# Patient Record
Sex: Female | Born: 1935 | Smoking: Never smoker
Health system: Southern US, Community
[De-identification: ages and names within clinical notes are randomized; demographics above are authoritative.]

## PROBLEM LIST (undated history)

## (undated) DIAGNOSIS — I639 Cerebral infarction, unspecified: Secondary | ICD-10-CM

## (undated) DIAGNOSIS — I10 Essential (primary) hypertension: Secondary | ICD-10-CM

## (undated) HISTORY — PX: BLADDER SURGERY: SHX569

---

## 2010-12-14 ENCOUNTER — Encounter (INDEPENDENT_AMBULATORY_CARE_PROVIDER_SITE_OTHER): Payer: Medicare Other | Admitting: Ophthalmology

## 2010-12-14 DIAGNOSIS — H2510 Age-related nuclear cataract, unspecified eye: Secondary | ICD-10-CM

## 2010-12-14 DIAGNOSIS — H43819 Vitreous degeneration, unspecified eye: Secondary | ICD-10-CM

## 2010-12-14 DIAGNOSIS — H3530 Unspecified macular degeneration: Secondary | ICD-10-CM

## 2010-12-14 DIAGNOSIS — H4420 Degenerative myopia, unspecified eye: Secondary | ICD-10-CM

## 2011-12-16 ENCOUNTER — Encounter (INDEPENDENT_AMBULATORY_CARE_PROVIDER_SITE_OTHER): Payer: Medicare Other | Admitting: Ophthalmology

## 2018-02-19 ENCOUNTER — Other Ambulatory Visit: Payer: Self-pay | Admitting: Family Medicine

## 2018-02-19 DIAGNOSIS — R921 Mammographic calcification found on diagnostic imaging of breast: Secondary | ICD-10-CM

## 2018-03-09 ENCOUNTER — Ambulatory Visit
Admission: RE | Admit: 2018-03-09 | Discharge: 2018-03-09 | Disposition: A | Discharge status: Home / Self Care | Payer: Medicare Other | Source: Ambulatory Visit | Attending: Family Medicine | Admitting: Family Medicine

## 2018-03-09 DIAGNOSIS — R921 Mammographic calcification found on diagnostic imaging of breast: Secondary | ICD-10-CM

## 2019-09-02 ENCOUNTER — Emergency Department (HOSPITAL_BASED_OUTPATIENT_CLINIC_OR_DEPARTMENT_OTHER): Payer: Medicare Other

## 2019-09-02 ENCOUNTER — Other Ambulatory Visit: Payer: Self-pay

## 2019-09-02 ENCOUNTER — Inpatient Hospital Stay (HOSPITAL_BASED_OUTPATIENT_CLINIC_OR_DEPARTMENT_OTHER)
Admission: EM | Admit: 2019-09-02 | Discharge: 2019-09-05 | DRG: 690 | Disposition: A | Discharge status: Skilled Nursing Facility | Payer: Medicare Other | Attending: Internal Medicine | Admitting: Internal Medicine

## 2019-09-02 ENCOUNTER — Encounter (HOSPITAL_BASED_OUTPATIENT_CLINIC_OR_DEPARTMENT_OTHER): Payer: Self-pay | Admitting: Emergency Medicine

## 2019-09-02 DIAGNOSIS — Z20822 Contact with and (suspected) exposure to covid-19: Secondary | ICD-10-CM | POA: Diagnosis present

## 2019-09-02 DIAGNOSIS — E785 Hyperlipidemia, unspecified: Secondary | ICD-10-CM | POA: Diagnosis present

## 2019-09-02 DIAGNOSIS — Z7982 Long term (current) use of aspirin: Secondary | ICD-10-CM

## 2019-09-02 DIAGNOSIS — N10 Acute tubulo-interstitial nephritis: Secondary | ICD-10-CM | POA: Diagnosis not present

## 2019-09-02 DIAGNOSIS — W19XXXA Unspecified fall, initial encounter: Secondary | ICD-10-CM | POA: Diagnosis present

## 2019-09-02 DIAGNOSIS — N12 Tubulo-interstitial nephritis, not specified as acute or chronic: Secondary | ICD-10-CM

## 2019-09-02 DIAGNOSIS — Z880 Allergy status to penicillin: Secondary | ICD-10-CM

## 2019-09-02 DIAGNOSIS — M25551 Pain in right hip: Secondary | ICD-10-CM

## 2019-09-02 DIAGNOSIS — M160 Bilateral primary osteoarthritis of hip: Secondary | ICD-10-CM | POA: Diagnosis present

## 2019-09-02 DIAGNOSIS — Z79899 Other long term (current) drug therapy: Secondary | ICD-10-CM

## 2019-09-02 DIAGNOSIS — R4189 Other symptoms and signs involving cognitive functions and awareness: Secondary | ICD-10-CM | POA: Diagnosis present

## 2019-09-02 DIAGNOSIS — E7800 Pure hypercholesterolemia, unspecified: Secondary | ICD-10-CM

## 2019-09-02 DIAGNOSIS — R739 Hyperglycemia, unspecified: Secondary | ICD-10-CM | POA: Diagnosis present

## 2019-09-02 DIAGNOSIS — E039 Hypothyroidism, unspecified: Secondary | ICD-10-CM | POA: Diagnosis present

## 2019-09-02 DIAGNOSIS — Z882 Allergy status to sulfonamides status: Secondary | ICD-10-CM

## 2019-09-02 DIAGNOSIS — K5900 Constipation, unspecified: Secondary | ICD-10-CM | POA: Diagnosis not present

## 2019-09-02 DIAGNOSIS — D649 Anemia, unspecified: Secondary | ICD-10-CM | POA: Diagnosis present

## 2019-09-02 DIAGNOSIS — K219 Gastro-esophageal reflux disease without esophagitis: Secondary | ICD-10-CM | POA: Diagnosis present

## 2019-09-02 DIAGNOSIS — I10 Essential (primary) hypertension: Secondary | ICD-10-CM | POA: Diagnosis present

## 2019-09-02 DIAGNOSIS — Z79890 Hormone replacement therapy (postmenopausal): Secondary | ICD-10-CM

## 2019-09-02 HISTORY — DX: Essential (primary) hypertension: I10

## 2019-09-02 LAB — CBC WITH DIFFERENTIAL/PLATELET
Abs Immature Granulocytes: 0.05 10*3/uL (ref 0.00–0.07)
Basophils Absolute: 0 10*3/uL (ref 0.0–0.1)
Basophils Relative: 0 %
Eosinophils Absolute: 0.1 10*3/uL (ref 0.0–0.5)
Eosinophils Relative: 1 %
HCT: 32.5 % — ABNORMAL LOW (ref 36.0–46.0)
Hemoglobin: 10.4 g/dL — ABNORMAL LOW (ref 12.0–15.0)
Immature Granulocytes: 1 %
Lymphocytes Relative: 1 %
Lymphs Abs: 0.1 10*3/uL — ABNORMAL LOW (ref 0.7–4.0)
MCH: 29.2 pg (ref 26.0–34.0)
MCHC: 32 g/dL (ref 30.0–36.0)
MCV: 91.3 fL (ref 80.0–100.0)
Monocytes Absolute: 0.4 10*3/uL (ref 0.1–1.0)
Monocytes Relative: 4 %
Neutro Abs: 8.2 10*3/uL — ABNORMAL HIGH (ref 1.7–7.7)
Neutrophils Relative %: 93 %
Platelets: 253 10*3/uL (ref 150–400)
RBC: 3.56 MIL/uL — ABNORMAL LOW (ref 3.87–5.11)
RDW: 14.5 % (ref 11.5–15.5)
WBC: 8.8 10*3/uL (ref 4.0–10.5)
nRBC: 0 % (ref 0.0–0.2)

## 2019-09-02 LAB — COMPREHENSIVE METABOLIC PANEL
ALT: 21 U/L (ref 0–44)
AST: 25 U/L (ref 15–41)
Albumin: 4.1 g/dL (ref 3.5–5.0)
Alkaline Phosphatase: 46 U/L (ref 38–126)
Anion gap: 9 (ref 5–15)
BUN: 23 mg/dL (ref 8–23)
CO2: 25 mmol/L (ref 22–32)
Calcium: 8.7 mg/dL — ABNORMAL LOW (ref 8.9–10.3)
Chloride: 103 mmol/L (ref 98–111)
Creatinine, Ser: 0.64 mg/dL (ref 0.44–1.00)
GFR calc Af Amer: >60 mL/min (ref >60)
GFR calc non Af Amer: >60 mL/min (ref >60)
Glucose, Bld: 129 mg/dL — ABNORMAL HIGH (ref 70–99)
Potassium: 3.7 mmol/L (ref 3.5–5.1)
Sodium: 137 mmol/L (ref 135–145)
Total Bilirubin: 0.9 mg/dL (ref 0.3–1.2)
Total Protein: 6.7 g/dL (ref 6.5–8.1)

## 2019-09-02 LAB — URINALYSIS, ROUTINE W REFLEX MICROSCOPIC
Bilirubin Urine: NEGATIVE
Glucose, UA: NEGATIVE mg/dL
Hgb urine dipstick: NEGATIVE
Ketones, ur: NEGATIVE mg/dL
Nitrite: NEGATIVE
Protein, ur: NEGATIVE mg/dL
Specific Gravity, Urine: 1.01 (ref 1.005–1.030)
pH: 5.5 (ref 5.0–8.0)

## 2019-09-02 LAB — PROTIME-INR
INR: 1.1 (ref 0.8–1.2)
Prothrombin Time: 13.7 seconds (ref 11.4–15.2)

## 2019-09-02 LAB — URINALYSIS, MICROSCOPIC (REFLEX): RBC / HPF: NONE SEEN RBC/hpf (ref 0–5)

## 2019-09-02 LAB — LACTIC ACID, PLASMA: Lactic Acid, Venous: 1 mmol/L (ref 0.5–1.9)

## 2019-09-02 LAB — SARS CORONAVIRUS 2 BY RT PCR (HOSPITAL ORDER, PERFORMED IN ~~LOC~~ HOSPITAL LAB): SARS Coronavirus 2: NEGATIVE

## 2019-09-02 MED ORDER — ATORVASTATIN CALCIUM 80 MG PO TABS
80.0000 mg | ORAL_TABLET | Freq: Every day | ORAL | Status: DC
  Administered 2019-09-03 – 2019-09-05 (×3): 80 mg via ORAL
  Filled 2019-09-02 (×3): qty 1

## 2019-09-02 MED ORDER — LACTATED RINGERS IV BOLUS (SEPSIS)
1000.0000 mL | Freq: Once | INTRAVENOUS | Status: AC
  Administered 2019-09-02: 1000 mL via INTRAVENOUS

## 2019-09-02 MED ORDER — ACETAMINOPHEN 500 MG PO TABS
1000.0000 mg | ORAL_TABLET | Freq: Once | ORAL | Status: AC
  Administered 2019-09-02: 1000 mg via ORAL
  Filled 2019-09-02: qty 2

## 2019-09-02 MED ORDER — ASPIRIN EC 81 MG PO TBEC
81.0000 mg | DELAYED_RELEASE_TABLET | Freq: Every day | ORAL | Status: DC
  Administered 2019-09-03 – 2019-09-05 (×3): 81 mg via ORAL
  Filled 2019-09-02 (×3): qty 1

## 2019-09-02 MED ORDER — FENTANYL CITRATE (PF) 100 MCG/2ML IJ SOLN
100.0000 ug | Freq: Once | INTRAMUSCULAR | Status: AC
  Administered 2019-09-02: 100 ug via INTRAVENOUS
  Filled 2019-09-02: qty 2

## 2019-09-02 MED ORDER — LISINOPRIL 20 MG PO TABS
20.0000 mg | ORAL_TABLET | Freq: Every day | ORAL | Status: DC
  Administered 2019-09-03 – 2019-09-05 (×3): 20 mg via ORAL
  Filled 2019-09-02 (×3): qty 1

## 2019-09-02 MED ORDER — LEVOTHYROXINE SODIUM 88 MCG PO TABS
88.0000 ug | ORAL_TABLET | Freq: Every day | ORAL | Status: DC
  Administered 2019-09-03 – 2019-09-05 (×3): 88 ug via ORAL
  Filled 2019-09-02 (×4): qty 1

## 2019-09-02 MED ORDER — SODIUM CHLORIDE 0.9 % IV SOLN
1.0000 g | INTRAVENOUS | Status: DC
  Administered 2019-09-02: 1 g via INTRAVENOUS
  Filled 2019-09-02 (×2): qty 10

## 2019-09-02 MED ORDER — PANTOPRAZOLE SODIUM 40 MG PO TBEC
40.0000 mg | DELAYED_RELEASE_TABLET | Freq: Every day | ORAL | Status: DC
  Administered 2019-09-03 – 2019-09-05 (×3): 40 mg via ORAL
  Filled 2019-09-02 (×3): qty 1

## 2019-09-02 MED ORDER — ONDANSETRON HCL 4 MG/2ML IJ SOLN
4.0000 mg | Freq: Once | INTRAMUSCULAR | Status: AC
  Administered 2019-09-02: 4 mg via INTRAVENOUS
  Filled 2019-09-02: qty 2

## 2019-09-02 MED ORDER — ESCITALOPRAM OXALATE 10 MG PO TABS
10.0000 mg | ORAL_TABLET | Freq: Every morning | ORAL | Status: DC
  Administered 2019-09-03 – 2019-09-05 (×3): 10 mg via ORAL
  Filled 2019-09-02 (×4): qty 1

## 2019-09-02 MED ORDER — FENOFIBRATE 160 MG PO TABS
160.0000 mg | ORAL_TABLET | Freq: Every day | ORAL | Status: DC
  Administered 2019-09-03 – 2019-09-05 (×3): 160 mg via ORAL
  Filled 2019-09-02 (×4): qty 1

## 2019-09-02 NOTE — ED Triage Notes (Signed)
Pt c/o right hip pain after falling sliding off bed. Pt also has known kidney stone causing flank pain and nausea. Pt with fever on arrival.

## 2019-09-02 NOTE — ED Provider Notes (Addendum)
[facility] EMERGENCY DEPARTMENT Provider Note   CSN: [id number] Arrival date & time: 09/02/19  1903     History Chief Complaint  Patient presents with  . Flank Pain  . Fall    Philipp OvensDeeanne Island is a 84 y.o. female.  84 year old female with past medical history including hypertension who presents with right flank pain, fevers, and right hip pain.  Patient presented to outside hospital ED on 6/28 with right flank pain and dysuria.  She was diagnosed with kidney stone and UTI and started on antibiotics.  She returned to the ED on 6/29 with ongoing symptoms as well as fevers.  She was again eventually discharged home.  She notes that her symptoms have continued and she feels terrible, generally weak and sick.  She continues to have right flank pain and nausea.  This evening, she slid out of bed and landed on her backside.  She has been having right hip pain for months but states the right hip pain has been worse since the fall.  The history is provided by the patient.  Flank Pain  Fall       Past Medical History:  Diagnosis Date  . Hypertension     Patient Active Problem List   Diagnosis Date Noted  . Pyelonephritis 09/02/2019    * The histories are not reviewed yet. Please review them in the "History" navigator section and refresh this SmartLink.   OB History   No obstetric history on file.     No family history on file.  Social History   Tobacco Use  . Smoking status: Not on file  Substance Use Topics  . Alcohol use: Not on file  . Drug use: Not on file    Home Medications Prior to Admission medications   Medication Sig Start Date End Date Taking? Authorizing Provider  anastrozole (ARIMIDEX) 1 MG tablet Take 1 tablet by mouth daily. 08/13/19  Yes [provider]  atorvastatin (LIPITOR) 80 MG tablet Take 1 tablet by mouth daily. 06/21/19  Yes [provider]  escitalopram (LEXAPRO) 10 MG tablet Take 1 tablet by mouth every morning.  06/07/19  Yes [provider]  fenofibrate 160 MG tablet Take 1 tablet by mouth daily. 04/20/19  Yes [provider]  fluticasone (FLONASE) 50 MCG/ACT nasal spray TWO SPRAYS EACH NOSTRIL DAILY 04/28/19  Yes [provider]  HYDROcodone-acetaminophen (NORCO/VICODIN) 5-325 MG tablet Take by mouth. 08/30/19 09/04/19 Yes [provider]  levothyroxine (SYNTHROID) 88 MCG tablet Take 1 tablet by mouth daily. 08/06/19  Yes [provider]  lisinopril (ZESTRIL) 20 MG tablet Take 1 tablet by mouth daily. 03/29/19  Yes [provider]  aspirin 81 MG EC tablet Take by mouth.    [provider]  meloxicam (MOBIC) 7.5 MG tablet Take 7.5 mg by mouth daily as needed. 08/17/19   [provider]  omeprazole (PRILOSEC) 20 MG capsule Take 20 mg by mouth daily. 08/25/19   [provider]  rOPINIRole (REQUIP) 0.5 MG tablet Take 0.5 mg by mouth at bedtime. 09/02/19   [provider]  tiZANidine (ZANAFLEX) 4 MG tablet Take 4 mg by mouth 3 (three) times daily. 08/25/19   [provider]    Allergies    Penicillins and Sulfa antibiotics  Review of Systems   Review of Systems  Genitourinary: Positive for flank pain.   All other systems reviewed and are negative except that which was mentioned in HPI  Physical Exam Updated Vital Signs BP Marland Kitchen(!)  142/69   Pulse 68   Temp 99.7 F (37.6 C) (Oral)   Resp 18   Ht 5\' 3"  (1.6 m)   Wt 73.5 kg   SpO2 100%   BMI 28.70 kg/m   Physical Exam Vitals and nursing note reviewed.  Constitutional:      General: She is not in acute distress.    Appearance: She is well-developed. She is ill-appearing.  HENT:     Head: Normocephalic and atraumatic.  Eyes:     Conjunctiva/sclera: Conjunctivae normal.  Cardiovascular:     Rate and Rhythm: Normal rate and regular rhythm.     Pulses: Normal pulses.     Heart sounds: Normal heart sounds. No murmur heard.   Pulmonary:     Effort: Pulmonary  effort is normal.     Breath sounds: Normal breath sounds.  Abdominal:     General: Bowel sounds are normal. There is no distension.     Palpations: Abdomen is soft.     Tenderness: There is no abdominal tenderness.  Musculoskeletal:        General: No deformity.     Cervical back: Neck supple.     Comments: R hip with normal ROM, no obvious deformity Trace BLE edema  Skin:    General: Skin is warm and dry.  Neurological:     Mental Status: She is alert and oriented to person, place, and time.     Comments: Fluent speech  Psychiatric:        Judgment: Judgment normal.     ED Results / Procedures / Treatments   Labs (all labs ordered are listed, but only abnormal results are displayed) Labs Reviewed  COMPREHENSIVE METABOLIC PANEL - Abnormal; Notable for the following components:      Result Value   Glucose, Bld 129 (*)    Calcium 8.7 (*)    All other components within normal limits  CBC WITH DIFFERENTIAL/PLATELET - Abnormal; Notable for the following components:   RBC 3.56 (*)    Hemoglobin 10.4 (*)    HCT 32.5 (*)    Neutro Abs 8.2 (*)    Lymphs Abs 0.1 (*)    All other components within normal limits  URINALYSIS, ROUTINE W REFLEX MICROSCOPIC - Abnormal; Notable for the following components:   Leukocytes,Ua TRACE (*)    All other components within normal limits  URINALYSIS, MICROSCOPIC (REFLEX) - Abnormal; Notable for the following components:   Bacteria, UA RARE (*)    All other components within normal limits  SARS CORONAVIRUS 2 BY RT PCR (HOSPITAL ORDER, PERFORMED IN [facility])  CULTURE, BLOOD (ROUTINE X 2)  CULTURE, BLOOD (ROUTINE X 2)  URINE CULTURE  LACTIC ACID, PLASMA  PROTIME-INR    EKG EKG Interpretation  Date/Time:  Thursday September 02 2019 20:00:33 EDT Ventricular Rate:  80 PR Interval:    QRS Duration: 86 QT Interval:  374 QTC Calculation: 432 R Axis:   -9 Text Interpretation: Atrial flutter with predominant 4:1 AV block Consider  anterior infarct No previous ECGs available Confirmed by 07-09-2001 [phone]) on 09/02/2019 8:30:27 PM   Radiology DG Chest Portable 1 View  Result Date: 09/02/2019 CLINICAL DATA:  Fever, right hip pain, flank pain EXAM: PORTABLE CHEST 1 VIEW COMPARISON:  None. FINDINGS: Single frontal view of the chest demonstrates mild enlargement the cardiac silhouette. Mild diffuse interstitial prominence likely reflects scarring. No airspace disease, effusion, or pneumothorax. Prominent vascular shadow right paratracheal region. No acute bony abnormalities. IMPRESSION: 1. Interstitial  scarring without superimposed airspace disease. Electronically Signed   By: Sharlet Salina M.D.   On: 09/02/2019 22:00   CT Renal Stone Study  Result Date: 09/02/2019 CLINICAL DATA:  Right flank pain EXAM: CT ABDOMEN AND PELVIS WITHOUT CONTRAST TECHNIQUE: Multidetector CT imaging of the abdomen and pelvis was performed following the standard protocol without IV contrast. COMPARISON:  None. FINDINGS: Lower chest: Bibasilar atelectasis. Cardiomegaly, coronary artery disease. No effusions. Hepatobiliary: Small layering stones within the gallbladder. No focal hepatic abnormality. Pancreas: No focal abnormality or ductal dilatation. Spleen: No focal abnormality.  Normal size. Adrenals/Urinary Tract: Calcification in the left renal hilum felt reflect calcified renal artery aneurysm, 9 mm. Calcification in the right renal hilum felt to be renovascular. No urinary tract stones or hydronephrosis. No renal or adrenal mass. Urinary bladder unremarkable. Stomach/Bowel: Appendix not definitively seen. Moderate stool burden in the colon. No evidence of bowel obstruction. Vascular/Lymphatic: Aortic atherosclerosis. No enlarged abdominal or pelvic lymph nodes. Reproductive: Prior hysterectomy.  No adnexal masses. Other: No free fluid or free air. Musculoskeletal: No acute bony abnormality. Degenerative changes in the lumbar spine. IMPRESSION: No renal or  ureteral stones.  No hydronephrosis. Aortic atherosclerosis. 9 mm left renal artery aneurysm. Moderate stool burden in the colon. No acute findings. Electronically Signed   By: Charlett Nose M.D.   On: 09/02/2019 21:02   DG Hip Unilat With Pelvis 2-3 Views Right  Result Date: 09/02/2019 CLINICAL DATA:  Chronic right hip pain EXAM: DG HIP (WITH OR WITHOUT PELVIS) 2-3V RIGHT COMPARISON:  None. FINDINGS: Degenerative changes in the hips bilaterally, right greater than left with joint space narrowing and spurring. No acute bony abnormality. Specifically, no fracture, subluxation, or dislocation. IMPRESSION: Degenerative changes in the hips bilaterally, right greater than left. No acute bony abnormality. Electronically Signed   By: Charlett Nose M.D.   On: 09/02/2019 20:59    Procedures Procedures (including critical care time) CRITICAL CARE Performed by: Ambrose Finland Sherryn Pollino   Total critical care time: 30 minutes  Critical care time was exclusive of separately billable procedures and treating other patients.  Critical care was necessary to treat or prevent imminent or life-threatening deterioration.  Critical care was time spent personally by me on the following activities: development of treatment plan with patient and/or surrogate as well as nursing, discussions with consultants, evaluation of patient's response to treatment, examination of patient, obtaining history from patient or surrogate, ordering and performing treatments and interventions, ordering and review of laboratory studies, ordering and review of radiographic studies, pulse oximetry and re-evaluation of patient's condition.  Medications Ordered in ED Medications  cefTRIAXone (ROCEPHIN) 1 g in sodium chloride 0.9 % 100 mL IVPB ( Intravenous Stopped 09/02/19 2031)  acetaminophen (TYLENOL) tablet 1,000 mg (1,000 mg Oral Given 09/02/19 1922)  ondansetron (ZOFRAN) injection 4 mg (4 mg Intravenous Given 09/02/19 1923)  lactated ringers bolus  1,000 mL (0 mLs Intravenous Stopped 09/02/19 2105)  fentaNYL (SUBLIMAZE) injection 100 mcg (100 mcg Intravenous Given 09/02/19 2114)    ED Course  I have reviewed the triage vital signs and the nursing notes.  Pertinent labs & imaging results that were available during my care of the patient were reviewed by me and considered in my medical decision making (see chart for details).    MDM Rules/Calculators/A&P                          Pt ill appearing on exam, alert and oriented, T103, remainder of  VSS. Hip without obvious injury, normal ROM.  Because of the fever and known stone with UTI, initiated a code sepsis with blood and urine cultures.  Gave ceftriaxone for suspected urinary source.  Lab work shows reassuring CMP, normal WBC count, normal lactate, negative COVID-19, UA with trace leukocytes, a few WBCs and rare bacteria.  I did review urinalysis results from outside hospitals on 6/29 and 6/28 which were nitrite positive and barely clearly suggested infection.  Obtained CT renal study which shows that previous renal stone has passed, no evidence of current ureteral stone.  Chest x-ray negative acute.  Hip x-ray with degenerative changes.  Patient's fever has improved and her vital signs have remained stable.  She was placed on some supplemental oxygen after receiving fentanyl for pain.  Because of her persistent symptoms despite antibiotics at home, recommended admission.  Discussed with [facility] hospitalist, Dr. Loney Loh, pt will be transferred for further care. Final Clinical Impression(s) / ED Diagnoses Final diagnoses:  Pyelonephritis  Right hip pain    Rx / DC Orders ED Discharge Orders    None       Juliza Machnik, Ambrose Finland, MD 09/02/19 2250    Reather Steller, Ambrose Finland, MD 09/02/19 2251

## 2019-09-02 NOTE — ED Notes (Signed)
ED Provider at bedside. 

## 2019-09-02 NOTE — ED Triage Notes (Signed)
Pt arrives via EMS from [facility] c/o recurrent R hip and flank pain which causes difficulty walking. Pt slid to the floor due to the pain. Endorses nausea.

## 2019-09-03 DIAGNOSIS — Z79890 Hormone replacement therapy (postmenopausal): Secondary | ICD-10-CM | POA: Diagnosis not present

## 2019-09-03 DIAGNOSIS — K219 Gastro-esophageal reflux disease without esophagitis: Secondary | ICD-10-CM | POA: Diagnosis present

## 2019-09-03 DIAGNOSIS — N12 Tubulo-interstitial nephritis, not specified as acute or chronic: Secondary | ICD-10-CM

## 2019-09-03 DIAGNOSIS — Z7982 Long term (current) use of aspirin: Secondary | ICD-10-CM | POA: Diagnosis not present

## 2019-09-03 DIAGNOSIS — M160 Bilateral primary osteoarthritis of hip: Secondary | ICD-10-CM | POA: Diagnosis present

## 2019-09-03 DIAGNOSIS — E7800 Pure hypercholesterolemia, unspecified: Secondary | ICD-10-CM

## 2019-09-03 DIAGNOSIS — I10 Essential (primary) hypertension: Secondary | ICD-10-CM

## 2019-09-03 DIAGNOSIS — Z880 Allergy status to penicillin: Secondary | ICD-10-CM | POA: Diagnosis not present

## 2019-09-03 DIAGNOSIS — M25551 Pain in right hip: Secondary | ICD-10-CM

## 2019-09-03 DIAGNOSIS — D649 Anemia, unspecified: Secondary | ICD-10-CM | POA: Diagnosis present

## 2019-09-03 DIAGNOSIS — Z882 Allergy status to sulfonamides status: Secondary | ICD-10-CM | POA: Diagnosis not present

## 2019-09-03 DIAGNOSIS — N10 Acute tubulo-interstitial nephritis: Secondary | ICD-10-CM | POA: Diagnosis present

## 2019-09-03 DIAGNOSIS — Z20822 Contact with and (suspected) exposure to covid-19: Secondary | ICD-10-CM | POA: Diagnosis present

## 2019-09-03 DIAGNOSIS — E039 Hypothyroidism, unspecified: Secondary | ICD-10-CM | POA: Diagnosis present

## 2019-09-03 DIAGNOSIS — K5900 Constipation, unspecified: Secondary | ICD-10-CM | POA: Diagnosis not present

## 2019-09-03 DIAGNOSIS — W19XXXA Unspecified fall, initial encounter: Secondary | ICD-10-CM | POA: Diagnosis present

## 2019-09-03 DIAGNOSIS — R739 Hyperglycemia, unspecified: Secondary | ICD-10-CM | POA: Diagnosis present

## 2019-09-03 DIAGNOSIS — R4189 Other symptoms and signs involving cognitive functions and awareness: Secondary | ICD-10-CM | POA: Diagnosis present

## 2019-09-03 DIAGNOSIS — E785 Hyperlipidemia, unspecified: Secondary | ICD-10-CM | POA: Diagnosis present

## 2019-09-03 DIAGNOSIS — Z79899 Other long term (current) drug therapy: Secondary | ICD-10-CM | POA: Diagnosis not present

## 2019-09-03 LAB — COMPREHENSIVE METABOLIC PANEL
ALT: 21 U/L (ref 0–44)
AST: 27 U/L (ref 15–41)
Albumin: 3 g/dL — ABNORMAL LOW (ref 3.5–5.0)
Alkaline Phosphatase: 39 U/L (ref 38–126)
Anion gap: 8 (ref 5–15)
BUN: 11 mg/dL (ref 8–23)
CO2: 25 mmol/L (ref 22–32)
Calcium: 8.2 mg/dL — ABNORMAL LOW (ref 8.9–10.3)
Chloride: 105 mmol/L (ref 98–111)
Creatinine, Ser: 0.58 mg/dL (ref 0.44–1.00)
GFR calc Af Amer: >60 mL/min (ref >60)
GFR calc non Af Amer: >60 mL/min (ref >60)
Glucose, Bld: 94 mg/dL (ref 70–99)
Potassium: 3 mmol/L — ABNORMAL LOW (ref 3.5–5.1)
Sodium: 138 mmol/L (ref 135–145)
Total Bilirubin: 0.4 mg/dL (ref 0.3–1.2)
Total Protein: 5.2 g/dL — ABNORMAL LOW (ref 6.5–8.1)

## 2019-09-03 LAB — PHOSPHORUS: Phosphorus: 3.2 mg/dL (ref 2.5–4.6)

## 2019-09-03 LAB — MAGNESIUM: Magnesium: 1.5 mg/dL — ABNORMAL LOW (ref 1.7–2.4)

## 2019-09-03 MED ORDER — ACETAMINOPHEN 650 MG RE SUPP: 650.0000 mg | Freq: Four times a day (QID) | RECTAL | Status: DC | PRN

## 2019-09-03 MED ORDER — MAGNESIUM OXIDE 400 (241.3 MG) MG PO TABS
400.0000 mg | ORAL_TABLET | Freq: Two times a day (BID) | ORAL | Status: AC
  Administered 2019-09-03 (×2): 400 mg via ORAL
  Filled 2019-09-03 (×2): qty 1

## 2019-09-03 MED ORDER — ACETAMINOPHEN 500 MG PO TABS
1000.0000 mg | ORAL_TABLET | Freq: Once | ORAL | Status: AC
  Administered 2019-09-03: 1000 mg via ORAL
  Filled 2019-09-03: qty 2

## 2019-09-03 MED ORDER — FENTANYL CITRATE (PF) 100 MCG/2ML IJ SOLN
100.0000 ug | Freq: Once | INTRAMUSCULAR | Status: AC
  Administered 2019-09-03: 100 ug via INTRAVENOUS
  Filled 2019-09-03: qty 2

## 2019-09-03 MED ORDER — ACETAMINOPHEN 325 MG PO TABS: 650.0000 mg | ORAL_TABLET | Freq: Four times a day (QID) | ORAL | Status: DC | PRN

## 2019-09-03 MED ORDER — CEPHALEXIN 500 MG PO CAPS
500.0000 mg | ORAL_CAPSULE | Freq: Three times a day (TID) | ORAL | Status: DC
  Administered 2019-09-03 – 2019-09-05 (×7): 500 mg via ORAL
  Filled 2019-09-03 (×7): qty 1

## 2019-09-03 MED ORDER — ENOXAPARIN SODIUM 40 MG/0.4ML ~~LOC~~ SOLN: 40.0000 mg | Freq: Every day | SUBCUTANEOUS | Status: DC

## 2019-09-03 MED ORDER — CALCIUM CARBONATE 1250 (500 CA) MG PO TABS
1.0000 | ORAL_TABLET | Freq: Every day | ORAL | Status: DC
  Administered 2019-09-03 – 2019-09-05 (×3): 500 mg via ORAL
  Filled 2019-09-03 (×3): qty 1

## 2019-09-03 MED ORDER — POLYETHYLENE GLYCOL 3350 17 G PO PACK
17.0000 g | PACK | Freq: Three times a day (TID) | ORAL | Status: AC
  Administered 2019-09-03 – 2019-09-04 (×2): 17 g via ORAL
  Filled 2019-09-03 (×3): qty 1

## 2019-09-03 MED ORDER — POTASSIUM CHLORIDE CRYS ER 20 MEQ PO TBCR
40.0000 meq | EXTENDED_RELEASE_TABLET | Freq: Two times a day (BID) | ORAL | Status: AC
  Administered 2019-09-03 (×2): 40 meq via ORAL
  Filled 2019-09-03 (×2): qty 2

## 2019-09-03 NOTE — Progress Notes (Signed)
PT Cancellation Note  Patient Details Name: Djeneba Barsch MRN: [id number] DOB: 1935/12/31   Cancelled Treatment:    Reason Eval/Treat Not Completed: Other (comment).  Eating and will reattempt as pt can allow.   Ivar Drape 09/03/2019, 12:46 PM   Samul Dada, PT MS Acute Rehab Dept. Number: [facility] [id number] and [facility] [phone]

## 2019-09-03 NOTE — Progress Notes (Addendum)
[facility] PROGRESS NOTE    Progress Note  Victoria Morris  WGN:[id number] DOB: [birth year] DOA: 09/02/2019 PCP: Patient, No Pcp Per     Brief Narrative:   Victoria Morris is an 84 y.o. female history significant of essential hypertension, GERD and hypothyroidism who was seen at her PCPs office on 09/01/2027 day prior to admission she was complaining of right side flank pain and difficulty urinating was diagnosed with a kidney stone and UTI was discharged on antibiotics (ciprofloxacin) home came back for ongoing symptoms as well as fever which has been worsening. She was found to be febrile hemodynamically stable UA was unimpressive she has been on antibiotics Assessment/Plan:   Acute pyelonephritis Patient is having severe for about a week and flank pain.  She was started on Cipro by her PCP. In the ED here she was started on IV Rocephin going back through her chart her UA on the 28th of this year was positive for a coli sensitive to Keflex. Urine culture will be inconclusive as she was previously on antibiotics. De-escalate antibiotic treatment to Keflex    Accidental fall leading to right hip pain: Hip x-ray showed degenerative changes but no acute fractures. We have consulted PT OT.  Hyperlipidemia: Continue Lipitor and fenofibrate.  Essential hypertension: Continue lisinopril blood pressure is fairly controlled.  Severe constipation: Start MiraLAX 3 times daily    DVT prophylaxis: lovenox Family Communication:none Status is: Inpatient  Remains inpatient appropriate because:Hemodynamically unstable   Dispo: The patient is from: Home              Anticipated d/c is to: Home              Anticipated d/c date is: 1 day              Patient currently is not medically stable to d/c.  Code Status:     Code Status Orders  (From admission, onward)         Start     Ordered   09/03/19 0437  Full code  Continuous        09/03/19 0442        Code  Status History    This patient has a current code status but no historical code status.   Advance Care Planning Activity        IV Access:    Peripheral IV   Procedures and diagnostic studies:   DG Chest Portable 1 View  Result Date: 09/02/2019 CLINICAL DATA:  Fever, right hip pain, flank pain EXAM: PORTABLE CHEST 1 VIEW COMPARISON:  None. FINDINGS: Single frontal view of the chest demonstrates mild enlargement the cardiac silhouette. Mild diffuse interstitial prominence likely reflects scarring. No airspace disease, effusion, or pneumothorax. Prominent vascular shadow right paratracheal region. No acute bony abnormalities. IMPRESSION: 1. Interstitial scarring without superimposed airspace disease. Electronically Signed   By: Sharlet Salina M.D.   On: 09/02/2019 22:00   CT Renal Stone Study  Result Date: 09/02/2019 CLINICAL DATA:  Right flank pain EXAM: CT ABDOMEN AND PELVIS WITHOUT CONTRAST TECHNIQUE: Multidetector CT imaging of the abdomen and pelvis was performed following the standard protocol without IV contrast. COMPARISON:  None. FINDINGS: Lower chest: Bibasilar atelectasis. Cardiomegaly, coronary artery disease. No effusions. Hepatobiliary: Small layering stones within the gallbladder. No focal hepatic abnormality. Pancreas: No focal abnormality or ductal dilatation. Spleen: No focal abnormality.  Normal size. Adrenals/Urinary Tract: Calcification in the left renal hilum felt reflect calcified renal artery aneurysm, 9 mm. Calcification in the right  renal hilum felt to be renovascular. No urinary tract stones or hydronephrosis. No renal or adrenal mass. Urinary bladder unremarkable. Stomach/Bowel: Appendix not definitively seen. Moderate stool burden in the colon. No evidence of bowel obstruction. Vascular/Lymphatic: Aortic atherosclerosis. No enlarged abdominal or pelvic lymph nodes. Reproductive: Prior hysterectomy.  No adnexal masses. Other: No free fluid or free air. Musculoskeletal:  No acute bony abnormality. Degenerative changes in the lumbar spine. IMPRESSION: No renal or ureteral stones.  No hydronephrosis. Aortic atherosclerosis. 9 mm left renal artery aneurysm. Moderate stool burden in the colon. No acute findings. Electronically Signed   By: Charlett Nose M.D.   On: 09/02/2019 21:02   DG Hip Unilat With Pelvis 2-3 Views Right  Result Date: 09/02/2019 CLINICAL DATA:  Chronic right hip pain EXAM: DG HIP (WITH OR WITHOUT PELVIS) 2-3V RIGHT COMPARISON:  None. FINDINGS: Degenerative changes in the hips bilaterally, right greater than left with joint space narrowing and spurring. No acute bony abnormality. Specifically, no fracture, subluxation, or dislocation. IMPRESSION: Degenerative changes in the hips bilaterally, right greater than left. No acute bony abnormality. Electronically Signed   By: Charlett Nose M.D.   On: 09/02/2019 20:59     Medical Consultants:    None.  Anti-Infectives:   IV Rocephin  Subjective:    Victoria Morris she relates her back pain is better, she continues to have dysuria.  Objective:    Vitals:   09/03/19 0000 09/03/19 0130 09/03/19 0230 09/03/19 0414  BP: (!) 168/63 (!) 165/88 (!) 154/71 (!) 142/54  Pulse: 74 81 75 66  Resp: 15 17 20 16   Temp:  (!) 102 F (38.9 C) 100.1 F (37.8 C) 98 F (36.7 C)  TempSrc:  Oral  Oral  SpO2: 96% 97% 97% 91%  Weight:      Height:       SpO2: 91 % O2 Flow Rate (L/min): 2 L/min   Intake/Output Summary (Last 24 hours) at 09/03/2019 0724 Last data filed at 09/02/2019 2245 Gross per 24 hour  Intake 1096.05 ml  Output 400 ml  Net 696.05 ml   Filed Weights   09/02/19 1913  Weight: 73.5 kg    Exam: General exam: In no acute distress. Respiratory system: Good air movement and clear to auscultation. Cardiovascular system: S1 & S2 heard, RRR. No JVD. Gastrointestinal system: Abdomen is nondistended, soft and nontender.  Extremities: No pain with external rotation. Skin: No rashes,  lesions or ulcers Psychiatry: Judgement and insight appear normal. Mood & affect appropriate.    Data Reviewed:    Labs: Basic Metabolic Panel: Recent Labs  Lab 09/02/19 1919  NA 137  K 3.7  CL 103  CO2 25  GLUCOSE 129*  BUN 23  CREATININE 0.64  CALCIUM 8.7*   GFR Estimated Creatinine Clearance: 52 mL/min (by C-G formula based on SCr of 0.64 mg/dL). Liver Function Tests: Recent Labs  Lab 09/02/19 1919  AST 25  ALT 21  ALKPHOS 46  BILITOT 0.9  PROT 6.7  ALBUMIN 4.1   No results for input(s): LIPASE, AMYLASE in the last 168 hours. No results for input(s): AMMONIA in the last 168 hours. Coagulation profile Recent Labs  Lab 09/02/19 1919  INR 1.1   COVID-19 Labs  No results for input(s): DDIMER, FERRITIN, LDH, CRP in the last 72 hours.  Lab Results  Component Value Date   SARSCOV2NAA NEGATIVE 09/02/2019    CBC: Recent Labs  Lab 09/02/19 1919  WBC 8.8  NEUTROABS 8.2*  HGB 10.4*  HCT  32.5*  MCV 91.3  PLT 253   Cardiac Enzymes: No results for input(s): CKTOTAL, CKMB, CKMBINDEX, TROPONINI in the last 168 hours. BNP (last 3 results) No results for input(s): PROBNP in the last 8760 hours. CBG: No results for input(s): GLUCAP in the last 168 hours. D-Dimer: No results for input(s): DDIMER in the last 72 hours. Hgb A1c: No results for input(s): HGBA1C in the last 72 hours. Lipid Profile: No results for input(s): CHOL, HDL, LDLCALC, TRIG, CHOLHDL, LDLDIRECT in the last 72 hours. Thyroid function studies: No results for input(s): TSH, T4TOTAL, T3FREE, THYROIDAB in the last 72 hours.  Invalid input(s): FREET3 Anemia work up: No results for input(s): VITAMINB12, FOLATE, FERRITIN, TIBC, IRON, RETICCTPCT in the last 72 hours. Sepsis Labs: Recent Labs  Lab 09/02/19 1919  WBC 8.8  LATICACIDVEN 1.0   Microbiology Recent Results (from the past 240 hour(s))  Culture, blood (Routine x 2)     Status: None (Preliminary result)   Collection Time:  09/02/19  7:19 PM   Specimen: BLOOD  Result Value Ref Range Status   Specimen Description   Final    BLOOD BLOOD LEFT ARM Performed at [facility], [address] [place], [place] [address]    Special Requests   Final    BOTTLES DRAWN AEROBIC AND ANAEROBIC Blood Culture adequate volume Performed at [facility] [place] [facility], [address] [place], [place] [address]    Culture   Final    NO GROWTH < 12 HOURS Performed at [facility], [address] [place], [place] [address]    Report Status PENDING  Incomplete  Culture, blood (Routine x 2)     Status: None (Preliminary result)   Collection Time: 09/02/19  7:35 PM   Specimen: BLOOD  Result Value Ref Range Status   Specimen Description   Final    BLOOD BLOOD LEFT HAND Performed at [facility] [place] [facility], [address] [place], [place] [address]    Special Requests   Final    BOTTLES DRAWN AEROBIC AND ANAEROBIC Blood Culture adequate volume Performed at [facility] [place] [facility], [address] [place], [place] [address]    Culture   Final    NO GROWTH < 12 HOURS Performed at [facility], [address] [place], [place] [address]    Report Status PENDING  Incomplete  SARS Coronavirus 2 by RT PCR (hospital order, performed in [facility] hospital lab) Nasopharyngeal Nasopharyngeal Swab     Status: None   Collection Time: 09/02/19  7:55 PM   Specimen: Nasopharyngeal Swab  Result Value Ref Range Status   SARS Coronavirus 2 NEGATIVE NEGATIVE Final    Comment: (NOTE) SARS-CoV-2 target nucleic acids are NOT DETECTED.  The SARS-CoV-2 RNA is generally detectable in upper and lower respiratory specimens during the acute phase of infection. The lowest concentration of SARS-CoV-2 viral copies this assay can detect is 250 copies / mL. A negative result does not preclude SARS-CoV-2 infection and should not be used as the sole basis for treatment or other patient management decisions.  A negative result may  occur with improper specimen collection / handling, submission of specimen other than nasopharyngeal swab, presence of viral mutation(s) within the areas targeted by this assay, and inadequate number of viral copies (<250 copies / mL). A negative result must be combined with clinical observations, patient history, and epidemiological information.  Fact Sheet for Patients:   BoilerBrush.com.cyhttps://www.fda.gov/media/136312/download  Fact Sheet  for Healthcare Providers: https://pope.com/  This test is not yet approved or  cleared by the [place] and has been authorized for detection and/or diagnosis of SARS-CoV-2 by FDA under an Emergency Use Authorization (EUA).  This EUA will remain in effect (meaning this test can be used) for the duration of the COVID-19 declaration under Section 564(b)(1) of the Act, 21 U.S.C. section 360bbb-3(b)(1), unless the authorization is terminated or revoked sooner.  Performed at [facility], [address] [place], [place] [address]      Medications:   . aspirin EC  81 mg Oral Daily  . atorvastatin  80 mg Oral Daily  . calcium carbonate  1 tablet Oral Q breakfast  . enoxaparin (LOVENOX) injection  40 mg Subcutaneous Daily  . escitalopram  10 mg Oral q morning - 10a  . fenofibrate  160 mg Oral Daily  . levothyroxine  88 mcg Oral Daily  . lisinopril  20 mg Oral Daily  . pantoprazole  40 mg Oral Daily   Continuous Infusions: . cefTRIAXone (ROCEPHIN)  IV Stopped (09/02/19 2026)      LOS: 0 days   Marinda Elk  [facility]  09/03/2019, 7:24 AM

## 2019-09-03 NOTE — TOC Initial Note (Signed)
Transition of Care [facility]) - Initial/Assessment Note    Patient Details  Name: Victoria Morris MRN: [id number] Date of Birth: 1935-10-22  Transition of Care [facility]) CM/SW Contact:    Doy Hutching, LCSW Phone Number: 09/03/2019, 10:37 AM  Clinical Narrative:                 CSW spoke with pt at bedside. Introduced self, role, reason for visit. Pt from home, she states she has just moved into a new apartment but has difficulty telling me where it is. Pt able to share that she uses a rollator to get around usually. Confirmed PCP and received permission to speak with her son.   Pt son Maurine Minister contacted at [phone]. CSW introduced self, role, reason for call. Pt son clarifies pt from [facility] which is senior living (similar to [employer]) at [facility]. Her new address is [address], [address], [place], [address]. He is interested in updates from MD about treatment plan and to hear what therapy recommends. His preference would be the SNF side at [facility] if able and gives permission for him to be contacted later after therapy sees her.   CSW spoke with [facility] at [facility] [phone]). If pt vaccinated they have an opening in shared room. If pt needs home health they often use [facility] for home health.   Expected Discharge Plan: Skilled Nursing Facility Barriers to Discharge: Continued Medical Work up, English as a second language teacher   Patient Goals and CMS Choice Patient states their goals for this hospitalization and ongoing recovery are:: to get stronger CMS Medicare.gov Compare Post Acute Care list provided to:: Patient Represenative (must comment) (pt is part of [facility]) Choice offered to / list presented to : Patient, Adult Children  Expected Discharge Plan and Services Expected Discharge Plan: Skilled Nursing Facility In-house Referral: Clinical Social Work Discharge Planning Services: CM Consult Post Acute Care Choice:  Horticulturist, commercial, Skilled Nursing Facility, Home Health Living arrangements for the past 2 months: Apartment, Independent Living Facility  Prior Living Arrangements/Services Living arrangements for the past 2 months: Apartment, Marketing executive Lives with:: Self Patient language and need for interpreter reviewed:: Yes (no needs)        Need for Family Participation in Patient Care: Yes (Comment) (assistance with daily cares) Care giver support system in place?: Yes (comment) (facility resident; adult children) Current home services: DME Criminal Activity/Legal Involvement Pertinent to Current Situation/Hospitalization: No - Comment as needed  Permission Sought/Granted  Share Information with NAME: Bristol Osentoski  Permission granted to share info w AGENCY: [facility]  Permission granted to share info w Relationship: son  Permission granted to share info w Contact Information: [phone]  Emotional Assessment Appearance:: Appears stated age Attitude/Demeanor/Rapport: Engaged Affect (typically observed): Accepting, Adaptable, Appropriate, Pleasant Orientation: : Oriented to Self, Oriented to Place, Fluctuating Orientation (Suspected and/or reported Sundowners) Alcohol / Substance Use: Not Applicable Psych Involvement:  (n/a)  Admission diagnosis:  Pyelonephritis [N12] Right hip pain [M25.551] Acute pyelonephritis [N10] Patient Active Problem List   Diagnosis Date Noted  . Acute pyelonephritis 09/03/2019  . Hypercholesterolemia 09/03/2019  . Essential hypertension 09/03/2019  . Hypocalcemia 09/03/2019  . GERD (gastroesophageal reflux disease) 09/03/2019  . Hypothyroidism 09/03/2019  . Pyelonephritis 09/02/2019   PCP:  Patient, No Pcp Per Pharmacy:   [facility] [place],  - [address] [place]  [address] Phone: [phone] Fax: [phone]   Readmission Risk  Interventions Readmission  Risk Prevention Plan 09/03/2019  Post Dischage Appt Not Complete  Medication Screening Complete  Transportation Screening Complete  Some recent data might be hidden

## 2019-09-03 NOTE — Social Work (Signed)
CSW acknowledging consult for SNF placement. Will follow for therapy recommendations needed to best determine disposition/for insurance authorization.   Adrine Hayworth, MSW, LCSW [place] Clinical Social Work    

## 2019-09-03 NOTE — H&P (Signed)
History and Physical  [name] VQQ:[id number] DOB: [birth year] DOA: 09/02/2019  Referring physician: Durene Fruits MD PCP: Patient, No Pcp Per  Patient coming from: [facility]  Chief Complaint: Flank pain and fall  HPI: Victoria Morris is a 84 y.o. female with medical history significant for hypertension, hyperlipidemia, GERD and hypothyroidism who was transferred from [facility] due to suspicion for acute pyelonephritis.  Patient was seen at [facility] ED on 6/28 with complaints of right-sided flank pain and difficulty urination, she was diagnosed with kidney stone and UTI and was discharged from the ED with antibiotics.  She returned to [facility] ED on the following day (6/29) with ongoing symptoms as well as fever, she was again discharged home from the ED.  Patient noted worsening symptoms with increased weakness, worsened right flank pain and nausea.  Yesterday in the evening, patient slid out of bed and landed on her backside due to weakness.  Though patient has been having right hip pain for several months, but this has since worsened since she sustained a fall.  She denies chest pain, shortness of breath, headache, numbness and tingling, diarrhea or constipation.  ED Course:  In the emergency department, patient was noted to be febrile with temperature of 103F, but otherwise, she was hemodynamically stable.  In the ED showed normocytic anemia, mild hyperglycemia, mild hypocalcemia.  Urinalysis was unimpressive (patient was already on antibiotics).  SARS coronavirus was negative.  CT abdomen and pelvis without contrast showed no acute findings.  Hip x-ray showed degenerative changes in the hips bilaterally, right greater than left with no acute bony abnormality  Review of Systems: Constitutional: Negative for chills and fever.  HENT: Negative for ear pain and sore throat.   Eyes: Negative for pain and visual disturbance.  Respiratory: Negative for cough, chest tightness and  shortness of breath.   Cardiovascular: Negative for chest pain and palpitations.  Gastrointestinal: Negative for abdominal pain and vomiting.  Endocrine: Negative for polyphagia and polyuria.  Genitourinary: Positive for flank pain.  Negative for decreased urine volume Musculoskeletal: Negative for arthralgias and back pain.  Skin: Negative for color change and rash.  Allergic/Immunologic: Negative for immunocompromised state.  Neurological: Negative for tremors, syncope, speech difficulty, weakness, light-headedness and headaches.  Hematological: Does not bruise/bleed easily.  All other systems reviewed and are negative   Past Medical History:  Diagnosis Date  . Hypertension    Social History:  has no history on file for tobacco use, alcohol use, and drug use.   Allergies  Allergen Reactions  . Penicillins Hives  . Sulfa Antibiotics Hives    No family history on file.   Prior to Admission medications   Medication Sig Start Date End Date Taking? Authorizing Provider  anastrozole (ARIMIDEX) 1 MG tablet Take 1 tablet by mouth daily. 08/13/19  Yes [provider]  atorvastatin (LIPITOR) 80 MG tablet Take 1 tablet by mouth daily. 06/21/19  Yes [provider]  escitalopram (LEXAPRO) 10 MG tablet Take 1 tablet by mouth every morning. 06/07/19  Yes [provider]  fenofibrate 160 MG tablet Take 1 tablet by mouth daily. 04/20/19  Yes [provider]  fluticasone (FLONASE) 50 MCG/ACT nasal spray TWO SPRAYS EACH NOSTRIL DAILY 04/28/19  Yes [provider]  HYDROcodone-acetaminophen (NORCO/VICODIN) 5-325 MG tablet Take by mouth. 08/30/19 09/04/19 Yes [provider]  levothyroxine (SYNTHROID) 88 MCG tablet Take 1 tablet by mouth daily. 08/06/19  Yes [provider]  lisinopril (ZESTRIL) 20 MG tablet Take 1 tablet by  mouth daily. 03/29/19  Yes [provider]  aspirin 81 MG EC tablet Take by mouth.    [provider]    meloxicam (MOBIC) 7.5 MG tablet Take 7.5 mg by mouth daily as needed. 08/17/19   [provider]  omeprazole (PRILOSEC) 20 MG capsule Take 20 mg by mouth daily. 08/25/19   [provider]  rOPINIRole (REQUIP) 0.5 MG tablet Take 0.5 mg by mouth at bedtime. 09/02/19   [provider]  tiZANidine (ZANAFLEX) 4 MG tablet Take 4 mg by mouth 3 (three) times daily. 08/25/19   [provider]    Physical Exam: BP (!) 142/54 (BP Location: Right Arm)   Pulse 66   Temp 98 F (36.7 C) (Oral)   Resp 16   Ht 5\' 3"  (1.6 m)   Wt 73.5 kg   SpO2 91%   BMI 28.70 kg/m   . General: 84 y.o. year-old female ill appearing in no acute distress.  Alert and oriented x3. 97 HEENT: Normocephalic, atraumatic.  Marland Kitchen Neck: Supple, trachea medial . Cardiovascular: Regular rate and rhythm with no rubs or gallops.  No thyromegaly or JVD noted.  No lower extremity edema. 2/4 pulses in all 4 extremities. Marland Kitchen Respiratory: Clear to auscultation with no wheezes or rales. Good inspiratory effort. . Abdomen: Soft nontender nondistended with normal bowel sounds x4 quadrants. . Muskuloskeletal: No cyanosis, clubbing.  Trace edema noted bilaterally . Neuro: CN II-XII intact, strength, sensation, reflexes . Skin: No ulcerative lesions noted or rashes . Psychiatry: Judgement and insight appear normal. Mood is appropriate for condition and setting          Labs on Admission:  Basic Metabolic Panel: Recent Labs  Lab 09/02/19 1919  NA 137  K 3.7  CL 103  CO2 25  GLUCOSE 129*  BUN 23  CREATININE 0.64  CALCIUM 8.7*   Liver Function Tests: Recent Labs  Lab 09/02/19 1919  AST 25  ALT 21  ALKPHOS 46  BILITOT 0.9  PROT 6.7  ALBUMIN 4.1   No results for input(s): LIPASE, AMYLASE in the last 168 hours. No results for input(s): AMMONIA in the last 168 hours. CBC: Recent Labs  Lab 09/02/19 1919  WBC 8.8  NEUTROABS 8.2*  HGB 10.4*  HCT 32.5*  MCV 91.3  PLT 253   Cardiac Enzymes: No  results for input(s): CKTOTAL, CKMB, CKMBINDEX, TROPONINI in the last 168 hours.  BNP (last 3 results) No results for input(s): BNP in the last 8760 hours.  ProBNP (last 3 results) No results for input(s): PROBNP in the last 8760 hours.  CBG: No results for input(s): GLUCAP in the last 168 hours.  Radiological Exams on Admission: DG Chest Portable 1 View  Result Date: 09/02/2019 CLINICAL DATA:  Fever, right hip pain, flank pain EXAM: PORTABLE CHEST 1 VIEW COMPARISON:  None. FINDINGS: Single frontal view of the chest demonstrates mild enlargement the cardiac silhouette. Mild diffuse interstitial prominence likely reflects scarring. No airspace disease, effusion, or pneumothorax. Prominent vascular shadow right paratracheal region. No acute bony abnormalities. IMPRESSION: 1. Interstitial scarring without superimposed airspace disease. Electronically Signed   By: 11/03/2019 M.D.   On: 09/02/2019 22:00   CT Renal Stone Study  Result Date: 09/02/2019 CLINICAL DATA:  Right flank pain EXAM: CT ABDOMEN AND PELVIS WITHOUT CONTRAST TECHNIQUE: Multidetector CT imaging of the abdomen and pelvis was performed following the standard protocol without IV contrast. COMPARISON:  None. FINDINGS: Lower chest: Bibasilar atelectasis. Cardiomegaly, coronary artery disease. No effusions.  Hepatobiliary: Small layering stones within the gallbladder. No focal hepatic abnormality. Pancreas: No focal abnormality or ductal dilatation. Spleen: No focal abnormality.  Normal size. Adrenals/Urinary Tract: Calcification in the left renal hilum felt reflect calcified renal artery aneurysm, 9 mm. Calcification in the right renal hilum felt to be renovascular. No urinary tract stones or hydronephrosis. No renal or adrenal mass. Urinary bladder unremarkable. Stomach/Bowel: Appendix not definitively seen. Moderate stool burden in the colon. No evidence of bowel obstruction. Vascular/Lymphatic: Aortic atherosclerosis. No enlarged  abdominal or pelvic lymph nodes. Reproductive: Prior hysterectomy.  No adnexal masses. Other: No free fluid or free air. Musculoskeletal: No acute bony abnormality. Degenerative changes in the lumbar spine. IMPRESSION: No renal or ureteral stones.  No hydronephrosis. Aortic atherosclerosis. 9 mm left renal artery aneurysm. Moderate stool burden in the colon. No acute findings. Electronically Signed   By: Charlett Nose M.D.   On: 09/02/2019 21:02   DG Hip Unilat With Pelvis 2-3 Views Right  Result Date: 09/02/2019 CLINICAL DATA:  Chronic right hip pain EXAM: DG HIP (WITH OR WITHOUT PELVIS) 2-3V RIGHT COMPARISON:  None. FINDINGS: Degenerative changes in the hips bilaterally, right greater than left with joint space narrowing and spurring. No acute bony abnormality. Specifically, no fracture, subluxation, or dislocation. IMPRESSION: Degenerative changes in the hips bilaterally, right greater than left. No acute bony abnormality. Electronically Signed   By: Charlett Nose M.D.   On: 09/02/2019 20:59    EKG: I independently viewed the EKG done and my findings are as followed: Atrial flutter with predominant 4:1 block heart rate of 80bpm  Assessment/Plan Present on Admission: . Acute pyelonephritis  Principal Problem:   Acute pyelonephritis Active Problems:   Hypercholesterolemia   Essential hypertension   Hypocalcemia   GERD (gastroesophageal reflux disease)   Hypothyroidism  Acute pyelonephritis  Patient complained of having burning sensation about a week ago, she also complained of right flank pain which started about the same time, currently resolved after receiving IV fentanyl Patient was already started on IV ceftriaxone Urine culture pending  Accidental fall  Hip x-ray showed degenerative changes in the hips bilaterally, right greater than left with no acute bony abnormality Continue PT/OT eval and treat Continue fall precaution and neuro checks  Hypercholesterolemia Continue  atorvastatin and fenofibrate  Essential hypertension Continue lisinopril  Hypocalcemia Continue Os-Cal  GERD Continue Protonix  Hypothyroidism Continue Synthroid  DVT prophylaxis: Lovenox, SCDs  Code Status: Full code  Family Communication: None at bedside  Disposition Plan:  Patient is from:                        [facility] Anticipated DC to:                   SNF or family members home Anticipated DC date:               2-3 days Anticipated DC barriers:          Clinical response to antibiotic treatment   Consults called: None  Admission status: Inpatient admission due to need for AV administration of antibiotics in the setting of possible acute pyelonephritis    Frankey Shown MD [facility] Pager [phone]  If 7PM-7AM, please contact night-coverage [email or web address] Password [email or web address]  09/03/2019, 5:04 AM

## 2019-09-03 NOTE — TOC Progression Note (Signed)
Transition of Care [facility]) - Progression Note    Patient Details  Name: Kaelie Henigan MRN: [id number] Date of Birth: [birth year]  [facility] CM/SW Contact  Doy Hutching, [place] Phone Number: 09/03/2019, 12:00 PM  Clinical Narrative:    [facility] to review pt. Pt son Maurine Minister amenable to SNF, he confirms pt has had her vaccines for COVID and is okay with semi private room. Ref #[id number], started auth with [facility]. Requested MD to call pt son Maurine Minister when he has time.   TOC team continuing to follow.   Expected Discharge Plan: Skilled Nursing Facility Barriers to Discharge: Continued Medical Work up, English as a second language teacher  Expected Discharge Plan and Services Expected Discharge Plan: Skilled Nursing Facility In-house Referral: Clinical Social Work Discharge Planning Services: CM Consult Post Acute Care Choice: Horticulturist, commercial, Skilled Nursing Facility, Home Health Living arrangements for the past 2 months: Apartment, Independent Living Facility  Readmission Risk Interventions Readmission Risk Prevention Plan 09/03/2019  Post Dischage Appt Not Complete  Medication Screening Complete  Transportation Screening Complete  Some recent data might be hidden

## 2019-09-03 NOTE — Evaluation (Signed)
Physical Therapy Evaluation Patient Details Name: Victoria Morris MRN: [id number] DOB: 03-24-1935 Today's Date: 09/03/2019   History of Present Illness  Pt is an 84 year old woman admitted on 09/02/19 with R flank pain and fever due to polynephritis. Pt with recent UTI and kidney stone per PCP. Pt slipped down from her bed due to weakness resulting in R hip pain with negative xray. PMH: HTN, GERD, hypothyroidism.  Clinical Impression  Prior to admission, pt ambulates with a Rollator and is independent with ADL's. Pt presents with decreased functional mobility secondary to generalized weakness, balance deficits, gait abnormalities, and cognitive impairments. Requiring min assist for ambulating limited hallway distances uses a walker. Consistently drifts towards the left and runs into objects, requiring assist to correct. Presents as a high fall risk based on decreased gait speed and safety awareness. Recommending ST rehab in SNF prior to return to ALF.     Follow Up Recommendations SNF    Equipment Recommendations  Other (comment) (defer)    Recommendations for Other Services       Precautions / Restrictions Precautions Precautions: Fall Restrictions Weight Bearing Restrictions: No      Mobility  Bed Mobility Overal bed mobility: Needs Assistance Bed Mobility: Supine to Sit     Supine to sit: Supervision;HOB elevated        Transfers Overall transfer level: Needs assistance Equipment used: Rolling walker (2 wheeled) Transfers: Sit to/from [employer] Sit to Stand: Min guard         General transfer comment: Min guard to rise from edge of bed  Ambulation/Gait Ambulation/Gait assistance: Min assist Gait Distance (Feet): 75 Feet Assistive device: Rolling walker (2 wheeled) Gait Pattern/deviations: Step-through pattern;Decreased stride length;Decreased dorsiflexion - right;Trunk flexed Gait velocity: decreased   General Gait Details: Pt with  increased trunk/hip flexion, right toe in plantarflexed position, tendency to drift left requiring consistent cues to correct and manual assist by PT.   Stairs            Wheelchair Mobility    Modified Rankin (Stroke Patients Only)       Balance Overall balance assessment: Needs assistance   Sitting balance-Leahy Scale: Fair     Standing balance support: Bilateral upper extremity supported Standing balance-Leahy Scale: Poor Standing balance comment: B UE and external support                             Pertinent Vitals/Pain Pain Assessment: No/denies pain    Home Living Family/patient expects to be discharged to:: Assisted living               Home Equipment: Walker - 4 wheels      Prior Function Level of Independence: Independent with assistive device(s)         Comments: assisted for medication, meals, housekeeping     Hand Dominance   Dominant Hand: Right    Extremity/Trunk Assessment   Upper Extremity Assessment Upper Extremity Assessment: Generalized weakness RUE Deficits / Details: tremor    Lower Extremity Assessment Lower Extremity Assessment: Generalized weakness;RLE deficits/detail RLE Deficits / Details: Ankle dorsiflexion limited 5-10 degrees from neutral    Cervical / Trunk Assessment Cervical / Trunk Assessment: Kyphotic  Communication   Communication: HOH (with hearing aids)  Cognition Arousal/Alertness: Awake/alert Behavior During Therapy: Impulsive Overall Cognitive Status: No family/caregiver present to determine baseline cognitive functioning  General Comments: Mildly impulsive, follows all 1 step commands      General Comments      Exercises     Assessment/Plan    PT Assessment Patient needs continued PT services  PT Problem List Decreased strength;Decreased activity tolerance;Decreased balance;Decreased mobility;Decreased cognition;Decreased safety  awareness       PT Treatment Interventions DME instruction;Gait training;Functional mobility training;Therapeutic activities;Therapeutic exercise;Balance training;Patient/family education    PT Goals (Current goals can be found in the Care Plan section)  Acute Rehab PT Goals Patient Stated Goal: get stronger PT Goal Formulation: With patient Time For Goal Achievement: 09/17/19 Potential to Achieve Goals: Good    Frequency Min 2X/week   Barriers to discharge        Co-evaluation               AM-PAC PT "6 Clicks" Mobility  Outcome Measure Help needed turning from your back to your side while in a flat bed without using bedrails?: None Help needed moving from lying on your back to sitting on the side of a flat bed without using bedrails?: A Little Help needed moving to and from a bed to a chair (including a wheelchair)?: A Little Help needed standing up from a chair using your arms (e.g., wheelchair or bedside chair)?: A Little Help needed to walk in hospital room?: A Little Help needed climbing 3-5 steps with a railing? : A Lot 6 Click Score: 18    End of Session Equipment Utilized During Treatment: Gait belt Activity Tolerance: Patient tolerated treatment well Patient left: in bed;with call bell/phone within reach;with bed alarm set   PT Visit Diagnosis: Unsteadiness on feet (R26.81);Other abnormalities of gait and mobility (R26.89);Muscle weakness (generalized) (M62.81);Difficulty in walking, not elsewhere classified (R26.2)    Time: 7989-2119 PT Time Calculation (min) (ACUTE ONLY): 17 min   Charges:   PT Evaluation $PT Eval Moderate Complexity: 1 Mod            Lillia Pauls, PT, DPT Acute Rehabilitation Services Pager [phone] Office [phone]   Norval [place] 09/03/2019, 4:37 PM

## 2019-09-03 NOTE — [place] FL2 (Signed)
[place] MEDICAID FL2 LEVEL OF CARE SCREENING TOOL     IDENTIFICATION  Patient Name: Victoria Morris Birthdate: [birth year] Sex: female Admission Date (Current Location): 09/02/2019  [facility] [place] Number:  Producer, television/film/video and Address:  [facility], [address], [place], [place] [address]      Provider Number: [id number]  Attending Physician Name and Address:  Marinda Elk, MD  Relative Name and Phone Number:       Current Level of Care: Hospital Recommended Level of Care: Skilled Nursing Facility Prior Approval Number:    Date Approved/Denied:   PASRR Number: [id number]  Discharge Plan: SNF    Current Diagnoses: Patient Active Problem List   Diagnosis Date Noted   Acute pyelonephritis 09/03/2019   Hypercholesterolemia 09/03/2019   Essential hypertension 09/03/2019   Hypocalcemia 09/03/2019   GERD (gastroesophageal reflux disease) 09/03/2019   Hypothyroidism 09/03/2019   Pyelonephritis 09/02/2019    Orientation RESPIRATION BLADDER Height & Weight     Self, Place  Normal Continent, External catheter Weight: 162 lb (73.5 kg) Height:  5\' 3"  (160 cm)  BEHAVIORAL SYMPTOMS/MOOD NEUROLOGICAL BOWEL NUTRITION STATUS      Continent Diet (see discharge summary)  AMBULATORY STATUS COMMUNICATION OF NEEDS Skin   Extensive Assist Verbally Normal                       Personal Care Assistance Level of Assistance  Bathing, Feeding, Dressing Bathing Assistance: Maximum assistance Feeding assistance: Independent Dressing Assistance: Maximum assistance     Functional Limitations Info  Sight, Hearing, Speech Sight Info: Adequate Hearing Info: Impaired Speech Info: Adequate    SPECIAL CARE FACTORS FREQUENCY  OT (By licensed OT), PT (By licensed PT)     PT Frequency: 5x week OT Frequency: 5x week            Contractures Contractures Info: Not present    Additional Factors Info  Code Status,  Allergies, Psychotropic Code Status Info: Full Code Allergies Info: Penicillins, Sulfa Antibiotics Psychotropic Info: escitalopram (LEXAPRO) tablet 10 mg at 10am PO daily         Current Medications (09/03/2019):  This is the current hospital active medication list Current Facility-Administered Medications  Medication Dose Route Frequency Provider Last Rate Last Admin   acetaminophen (TYLENOL) tablet 650 mg  650 mg Oral Q6H PRN Adefeso, Oladapo, DO       Or   acetaminophen (TYLENOL) suppository 650 mg  650 mg Rectal Q6H PRN Adefeso, Oladapo, DO       aspirin EC tablet 81 mg  81 mg Oral Daily Little, 11/04/2019, MD   81 mg at 09/03/19 0955   atorvastatin (LIPITOR) tablet 80 mg  80 mg Oral Daily Little, 11/04/19, MD   80 mg at 09/03/19 0954   calcium carbonate (OS-CAL - dosed in mg of elemental calcium) tablet 500 mg of elemental calcium  1 tablet Oral Q breakfast Adefeso, Oladapo, DO   500 mg of elemental calcium at 09/03/19 0827   cephALEXin (KEFLEX) capsule 500 mg  500 mg Oral Q8H 11/04/19, MD   500 mg at 09/03/19 0827   escitalopram (LEXAPRO) tablet 10 mg  10 mg Oral q morning - 10a Little, 11/04/19, MD   10 mg at 09/03/19 0955   fenofibrate tablet 160 mg  160 mg Oral Daily Little, 11/04/19, MD   160 mg at 09/03/19 0955   levothyroxine (SYNTHROID) tablet 88 mcg  88 mcg Oral Daily Little, Ambrose Finland, MD   88 mcg at 09/03/19 6599   lisinopril (ZESTRIL) tablet 20 mg  20 mg Oral Daily Little, Ambrose Finland, MD   20 mg at 09/03/19 0955   pantoprazole (PROTONIX) EC tablet 40 mg  40 mg Oral Daily Little, Ambrose Finland, MD   40 mg at 09/03/19 0955   polyethylene glycol (MIRALAX / GLYCOLAX) packet 17 g  17 g Oral TID Marinda Elk, MD         Discharge Medications: Please see discharge summary for a list of discharge medications.  Relevant Imaging Results:  Relevant Lab Results:   Additional Information SS#[id number] [place],  [place]

## 2019-09-03 NOTE — ED Notes (Signed)
Pt gives verbal consent for transfer 

## 2019-09-03 NOTE — Evaluation (Signed)
Occupational Therapy Evaluation Patient Details Name: Victoria Morris MRN: [id number] DOB: 07-25-1935 Today's Date: 09/03/2019    History of Present Illness Pt is an 84 year old woman admitted on 09/02/19 with R flank pain and fever due to polynephritis. Pt with recent UTI and kidney stone per PCP. Pt slipped down from her bed due to weakness resulting in R hip pain with negative xray. PMH: HTN, GERD, hypothyroidism.   Clinical Impression   Pt reports ambulating with a rollator and completing her ADL without assistance. She reports recently moving into an ALF. Pt presents with generalized weakness and poor standing balance. She requires an extended amount of time for any mobility and tends to want to sit before aligned with sitting surface. Pt transferred x 2 with mod assist to rise and steady and min assist once she was on her feet. She requires min to total assist for ADL. No family available to determine baseline cognition. Recommending ST rehab in SNF prior to return to her ALF.     Follow Up Recommendations  SNF    Equipment Recommendations  None recommended by OT    Recommendations for Other Services       Precautions / Restrictions Precautions Precautions: Fall      Mobility Bed Mobility Overal bed mobility: Needs Assistance Bed Mobility: Supine to Sit     Supine to sit: Supervision;HOB elevated     General bed mobility comments: increased time  Transfers Overall transfer level: Needs assistance Equipment used: Rolling walker (2 wheeled) Transfers: Sit to/from [employer] Sit to Stand: Mod assist Stand pivot transfers: Min assist       General transfer comment: cues for technique and to scoot hips to EOB, assist to rise and steady, pt with posterior bias    Balance Overall balance assessment: Needs assistance   Sitting balance-Leahy Scale: Fair     Standing balance support: Bilateral upper extremity supported Standing balance-Leahy Scale:  Poor Standing balance comment: B UE and external support                           ADL either performed or assessed with clinical judgement   ADL Overall ADL's : Needs assistance/impaired Eating/Feeding: Set up;Sitting   Grooming: Oral care;Sitting;Set up   Upper Body Bathing: Minimal assistance;Sitting   Lower Body Bathing: Maximal assistance;Sit to/from stand   Upper Body Dressing : Minimal assistance;Sitting   Lower Body Dressing: Sit to/from stand;Total assistance   Toilet Transfer: Minimal assistance;Stand-pivot;BSC;RW   Toileting- Clothing Manipulation and Hygiene: Total assistance;Sit to/from stand               Vision Baseline Vision/History: Wears glasses Wears Glasses: At all times Patient Visual Report: No change from baseline       Perception     Praxis      Pertinent Vitals/Pain Pain Assessment: No/denies pain     Hand Dominance Right   Extremity/Trunk Assessment Upper Extremity Assessment Upper Extremity Assessment: Generalized weakness;RUE deficits/detail RUE Deficits / Details: tremor   Lower Extremity Assessment Lower Extremity Assessment: Defer to PT evaluation   Cervical / Trunk Assessment Cervical / Trunk Assessment: Kyphotic   Communication Communication Communication: HOH (with hearing aids)   Cognition Arousal/Alertness: Awake/alert Behavior During Therapy: WFL for tasks assessed/performed Overall Cognitive Status: No family/caregiver present to determine baseline cognitive functioning  General Comments       Exercises     Shoulder Instructions      Home Living Family/patient expects to be discharged to:: Assisted living                             Home Equipment: Walker - 4 wheels          Prior Functioning/Environment Level of Independence: Independent with assistive device(s)        Comments: assisted for medication, meals,  housekeeping        OT Problem List: Decreased strength;Decreased activity tolerance;Impaired balance (sitting and/or standing);Impaired tone;Impaired UE functional use;Decreased cognition;Decreased safety awareness;Decreased knowledge of use of DME or AE      OT Treatment/Interventions: Self-care/ADL training;DME and/or AE instruction;Therapeutic activities;Patient/family education;Balance training    OT Goals(Current goals can be found in the care plan section) Acute Rehab OT Goals Patient Stated Goal: get stronger OT Goal Formulation: With patient Time For Goal Achievement: 09/17/19 Potential to Achieve Goals: Good ADL Goals Pt Will Perform Grooming: with min guard assist;standing Pt Will Perform Upper Body Dressing: with set-up;sitting Pt Will Perform Lower Body Dressing: with min assist;sit to/from stand Pt Will Transfer to Toilet: with min guard assist;ambulating;bedside commode Pt Will Perform Toileting - Clothing Manipulation and hygiene: with min guard assist;sit to/from stand  OT Frequency: Min 2X/week   Barriers to D/C:            Co-evaluation              AM-PAC OT "6 Clicks" Daily Activity     Outcome Measure Help from another person eating meals?: A Little Help from another person taking care of personal grooming?: A Little Help from another person toileting, which includes using toliet, bedpan, or urinal?: A Lot Help from another person bathing (including washing, rinsing, drying)?: A Lot Help from another person to put on and taking off regular upper body clothing?: A Little Help from another person to put on and taking off regular lower body clothing?: Total 6 Click Score: 14   End of Session Equipment Utilized During Treatment: Gait belt;Rolling walker Nurse Communication: Mobility status (needs eye drops)  Activity Tolerance: Patient tolerated treatment well Patient left: in chair;with call bell/phone within reach;with chair alarm set  OT Visit  Diagnosis: Unsteadiness on feet (R26.81);Other abnormalities of gait and mobility (R26.89);Muscle weakness (generalized) (M62.81);Other symptoms and signs involving cognitive function                Time: 1010-1043 OT Time Calculation (min): 33 min Charges:  OT General Charges $OT Visit: 1 Visit OT Evaluation $OT Eval Moderate Complexity: 1 Mod OT Treatments $Self Care/Home Management : 8-22 mins  Martie Round, OTR/L Acute Rehabilitation Services Pager: [phone] Office: [phone]  Evern Bio 09/03/2019, 10:54 AM

## 2019-09-04 LAB — VITAMIN B12: Vitamin B-12: 1753 pg/mL — ABNORMAL HIGH (ref 180–914)

## 2019-09-04 LAB — TSH: TSH: 2.407 u[IU]/mL (ref 0.350–4.500)

## 2019-09-04 LAB — URINE CULTURE: Culture: NO GROWTH

## 2019-09-04 MED ORDER — MELATONIN 5 MG PO TABS
5.0000 mg | ORAL_TABLET | Freq: Every evening | ORAL | Status: DC | PRN
  Administered 2019-09-04: 5 mg via ORAL
  Filled 2019-09-04: qty 1

## 2019-09-04 MED ORDER — POTASSIUM CHLORIDE CRYS ER 20 MEQ PO TBCR
20.0000 meq | EXTENDED_RELEASE_TABLET | Freq: Two times a day (BID) | ORAL | Status: DC
  Administered 2019-09-04 – 2019-09-05 (×3): 20 meq via ORAL
  Filled 2019-09-04 (×3): qty 1

## 2019-09-04 NOTE — TOC Progression Note (Signed)
Transition of Care [facility]) - Progression Note    Patient Details  Name: Lada Fulbright MRN: [id number] Date of Birth: 1936-01-28  [facility]) CM/SW Contact  Patrice Paradise, LCSW Phone Number: [phone] 09/04/2019, 11:39 AM  Clinical Narrative:     CSW spoke with patient's son Maurine Minister and he inquired about the co pay days that would be required for patient to go to a SNF CSW explained the co pay days and he stated that he would still move forward with patient go to SNF.  TOC team will continue to assist with discharge planning needs.   Expected Discharge Plan: Skilled Nursing Facility Barriers to Discharge: Continued Medical Work up, English as a second language teacher  Expected Discharge Plan and Services Expected Discharge Plan: Skilled Nursing Facility In-house Referral: Clinical Social Work Discharge Planning Services: [employer] Consult Post Acute Care Choice: Horticulturist, commercial, Skilled Nursing Facility, Home Health Living arrangements for the past 2 months: Apartment, Independent Living Facility                                       Social Determinants of Health (SDOH) Interventions    Readmission Risk Interventions Readmission Risk Prevention Plan 09/03/2019  Post Dischage Appt Not Complete  Medication Screening Complete  Transportation Screening Complete  Some recent data might be hidden

## 2019-09-04 NOTE — Progress Notes (Addendum)
[facility] PROGRESS NOTE    Progress Note  Victoria Morris  HEN:[id number] DOB: Nov 13, 1935 DOA: 09/02/2019 PCP: Elijio Miles., MD     Brief Narrative:   Victoria Morris is an 84 y.o. female history significant of essential hypertension, GERD and hypothyroidism who was seen at her PCPs office on 09/01/2027 day prior to admission she was complaining of right side flank pain and difficulty urinating was diagnosed with a kidney stone and UTI was discharged on antibiotics (ciprofloxacin) home came back for ongoing symptoms as well as fever which has been worsening. She was found to be febrile hemodynamically stable UA was unimpressive she has been on antibiotics Assessment/Plan:   Acute pyelonephritis Continue oral Keflex, she has remained afebrile with no leukocytosis. Physical therapy evaluated the patient and recommended home health PT. Consult transitions of care teams. Patient is medically stable to transfer to skilled nursing facility.    Accidental fall leading to right hip pain: Hip x-ray showed degenerative changes but no acute fractures. Physical therapy evaluated the patient and recommended home health PT.  Hyperlipidemia: Continue Lipitor and fenofibrate.  Essential hypertension: Continue lisinopril blood pressure is fairly controlled.  Severe constipation: De-escalate MiraLAX to once a day.  Cognitive impairment: Check a b12 and TSH.    DVT prophylaxis: lovenox Family Communication:none Status is: Inpatient  Remains inpatient appropriate because:Hemodynamically unstable   Dispo: The patient is from: Home              Anticipated d/c is to: Skilled nursing facility              Anticipated d/c date is: 1 day              Patient currently is medically stable to d/c.  To DC to skilled nursing facility.  Code Status:     Code Status Orders  (From admission, onward)         Start     Ordered   09/03/19 0437  Full code  Continuous         09/03/19 0442        Code Status History    This patient has a current code status but no historical code status.   Advance Care Planning Activity        IV Access:    Peripheral IV   Procedures and diagnostic studies:   DG Chest Portable 1 View  Result Date: 09/02/2019 CLINICAL DATA:  Fever, right hip pain, flank pain EXAM: PORTABLE CHEST 1 VIEW COMPARISON:  None. FINDINGS: Single frontal view of the chest demonstrates mild enlargement the cardiac silhouette. Mild diffuse interstitial prominence likely reflects scarring. No airspace disease, effusion, or pneumothorax. Prominent vascular shadow right paratracheal region. No acute bony abnormalities. IMPRESSION: 1. Interstitial scarring without superimposed airspace disease. Electronically Signed   By: Sharlet Salina M.D.   On: 09/02/2019 22:00   CT Renal Stone Study  Result Date: 09/02/2019 CLINICAL DATA:  Right flank pain EXAM: CT ABDOMEN AND PELVIS WITHOUT CONTRAST TECHNIQUE: Multidetector CT imaging of the abdomen and pelvis was performed following the standard protocol without IV contrast. COMPARISON:  None. FINDINGS: Lower chest: Bibasilar atelectasis. Cardiomegaly, coronary artery disease. No effusions. Hepatobiliary: Small layering stones within the gallbladder. No focal hepatic abnormality. Pancreas: No focal abnormality or ductal dilatation. Spleen: No focal abnormality.  Normal size. Adrenals/Urinary Tract: Calcification in the left renal hilum felt reflect calcified renal artery aneurysm, 9 mm. Calcification in the right renal hilum felt to be renovascular. No urinary tract stones  or hydronephrosis. No renal or adrenal mass. Urinary bladder unremarkable. Stomach/Bowel: Appendix not definitively seen. Moderate stool burden in the colon. No evidence of bowel obstruction. Vascular/Lymphatic: Aortic atherosclerosis. No enlarged abdominal or pelvic lymph nodes. Reproductive: Prior hysterectomy.  No adnexal masses. Other: No free fluid  or free air. Musculoskeletal: No acute bony abnormality. Degenerative changes in the lumbar spine. IMPRESSION: No renal or ureteral stones.  No hydronephrosis. Aortic atherosclerosis. 9 mm left renal artery aneurysm. Moderate stool burden in the colon. No acute findings. Electronically Signed   By: Charlett Nose M.D.   On: 09/02/2019 21:02   DG Hip Unilat With Pelvis 2-3 Views Right  Result Date: 09/02/2019 CLINICAL DATA:  Chronic right hip pain EXAM: DG HIP (WITH OR WITHOUT PELVIS) 2-3V RIGHT COMPARISON:  None. FINDINGS: Degenerative changes in the hips bilaterally, right greater than left with joint space narrowing and spurring. No acute bony abnormality. Specifically, no fracture, subluxation, or dislocation. IMPRESSION: Degenerative changes in the hips bilaterally, right greater than left. No acute bony abnormality. Electronically Signed   By: Charlett Nose M.D.   On: 09/02/2019 20:59     Medical Consultants:    None.  Anti-Infectives:   IV Rocephin  Subjective:    Victoria Morris rates her dysuria and back pain are better.  Objective:    Vitals:   09/03/19 0414 09/03/19 1452 09/03/19 2103 09/04/19 0513  BP: (!) 142/54 (!) 144/87 (!) 137/58 (!) 149/80  Pulse: 66 73 68 70  Resp: 16 16 18 18   Temp: 98 F (36.7 C) 98.4 F (36.9 C) 99.2 F (37.3 C) 98.3 F (36.8 C)  TempSrc: Oral Oral Oral Oral  SpO2: 91% 93% 93% 98%  Weight:      Height:       SpO2: 98 % O2 Flow Rate (L/min): 2 L/min   Intake/Output Summary (Last 24 hours) at 09/04/2019 0849 Last data filed at 09/04/2019 0532 Gross per 24 hour  Intake 940 ml  Output 825 ml  Net 115 ml   Filed Weights   09/02/19 1913  Weight: 73.5 kg    Exam: General exam: In no acute distress. Respiratory system: Good air movement and clear to auscultation. Cardiovascular system: S1 & S2 heard, RRR. No JVD. Gastrointestinal system: Abdomen is nondistended, soft and nontender.  Extremities: No pedal edema. Skin: No rashes,  lesions or ulcers Psychiatry: Judgement and insight appear normal. Mood & affect appropriate.   Data Reviewed:    Labs: Basic Metabolic Panel: Recent Labs  Lab 09/02/19 1919 09/03/19 0745  NA 137 138  K 3.7 3.0*  CL 103 105  CO2 25 25  GLUCOSE 129* 94  BUN 23 11  CREATININE 0.64 0.58  CALCIUM 8.7* 8.2*  MG  --  1.5*  PHOS  --  3.2   GFR Estimated Creatinine Clearance: 52 mL/min (by C-G formula based on SCr of 0.58 mg/dL). Liver Function Tests: Recent Labs  Lab 09/02/19 1919 09/03/19 0745  AST 25 27  ALT 21 21  ALKPHOS 46 39  BILITOT 0.9 0.4  PROT 6.7 5.2*  ALBUMIN 4.1 3.0*   No results for input(s): LIPASE, AMYLASE in the last 168 hours. No results for input(s): AMMONIA in the last 168 hours. Coagulation profile Recent Labs  Lab 09/02/19 1919  INR 1.1   COVID-19 Labs  No results for input(s): DDIMER, FERRITIN, LDH, CRP in the last 72 hours.  Lab Results  Component Value Date   SARSCOV2NAA NEGATIVE 09/02/2019    CBC: Recent Labs  Lab 09/02/19 1919  WBC 8.8  NEUTROABS 8.2*  HGB 10.4*  HCT 32.5*  MCV 91.3  PLT 253   Cardiac Enzymes: No results for input(s): CKTOTAL, CKMB, CKMBINDEX, TROPONINI in the last 168 hours. BNP (last 3 results) No results for input(s): PROBNP in the last 8760 hours. CBG: No results for input(s): GLUCAP in the last 168 hours. D-Dimer: No results for input(s): DDIMER in the last 72 hours. Hgb A1c: No results for input(s): HGBA1C in the last 72 hours. Lipid Profile: No results for input(s): CHOL, HDL, LDLCALC, TRIG, CHOLHDL, LDLDIRECT in the last 72 hours. Thyroid function studies: No results for input(s): TSH, T4TOTAL, T3FREE, THYROIDAB in the last 72 hours.  Invalid input(s): FREET3 Anemia work up: No results for input(s): VITAMINB12, FOLATE, FERRITIN, TIBC, IRON, RETICCTPCT in the last 72 hours. Sepsis Labs: Recent Labs  Lab 09/02/19 1919  WBC 8.8  LATICACIDVEN 1.0   Microbiology Recent Results (from the  past 240 hour(s))  Culture, blood (Routine x 2)     Status: None (Preliminary result)   Collection Time: 09/02/19  7:19 PM   Specimen: BLOOD  Result Value Ref Range Status   Specimen Description   Final    BLOOD BLOOD LEFT ARM Performed at [facility] [place], [address] [place], [place] [address]    Special Requests   Final    BOTTLES DRAWN AEROBIC AND ANAEROBIC Blood Culture adequate volume Performed at [facility] [place], [address] [place], [place] [address]    Culture   Final    NO GROWTH < 12 HOURS Performed at [facility], [address] [place], [place] [address]    Report Status PENDING  Incomplete  Culture, blood (Routine x 2)     Status: None (Preliminary result)   Collection Time: 09/02/19  7:35 PM   Specimen: BLOOD  Result Value Ref Range Status   Specimen Description   Final    BLOOD BLOOD LEFT HAND Performed at [facility] [place], [address] [place], [place] [address]    Special Requests   Final    BOTTLES DRAWN AEROBIC AND ANAEROBIC Blood Culture adequate volume Performed at [facility] [place], [address] [place], [place] [address]    Culture   Final    NO GROWTH < 12 HOURS Performed at [facility], [address] [place], [place] [address]    Report Status PENDING  Incomplete  SARS Coronavirus 2 by RT PCR (hospital order, performed in [facility] lab) Nasopharyngeal Nasopharyngeal Swab     Status: None   Collection Time: 09/02/19  7:55 PM   Specimen: Nasopharyngeal Swab  Result Value Ref Range Status   SARS Coronavirus 2 NEGATIVE NEGATIVE Final    Comment: (NOTE) SARS-CoV-2 target nucleic acids are NOT DETECTED.  The SARS-CoV-2 RNA is generally detectable in upper and lower respiratory specimens during the acute phase of infection. The lowest concentration of SARS-CoV-2 viral copies this assay can detect is 250 copies / mL. A negative result does not preclude SARS-CoV-2 infection and should  not be used as the sole basis for treatment or other patient management decisions.  A negative result may occur with improper specimen collection / handling, submission of specimen other than nasopharyngeal swab, presence of viral mutation(s) within the areas targeted by this assay, and inadequate number of viral copies (<250 copies / mL). A negative result must be combined with clinical observations, patient history,  and epidemiological information.  Fact Sheet for Patients:   BoilerBrush.com.cy  Fact Sheet for Healthcare Providers: https://pope.com/  This test is not yet approved or  cleared by the [place] FDA and has been authorized for detection and/or diagnosis of SARS-CoV-2 by FDA under an Emergency Use Authorization (EUA).  This EUA will remain in effect (meaning this test can be used) for the duration of the COVID-19 declaration under Section 564(b)(1) of the Act, 21 U.S.C. section 360bbb-3(b)(1), unless the authorization is terminated or revoked sooner.  Performed at [facility], [address] [place], [place] [address]      Medications:   . aspirin EC  81 mg Oral Daily  . atorvastatin  80 mg Oral Daily  . calcium carbonate  1 tablet Oral Q breakfast  . cephALEXin  500 mg Oral Q8H  . escitalopram  10 mg Oral q morning - 10a  . fenofibrate  160 mg Oral Daily  . levothyroxine  88 mcg Oral Daily  . lisinopril  20 mg Oral Daily  . pantoprazole  40 mg Oral Daily  . polyethylene glycol  17 g Oral TID   Continuous Infusions:     LOS: 1 day   Marinda Elk  [facility]  09/04/2019, 8:49 AM

## 2019-09-04 NOTE — TOC Progression Note (Signed)
Transition of Care [facility]) - Progression Note    Patient Details  Name: Victoria Morris MRN: [id number] Date of Birth: February 06, 1936  [facility] CM/SW Contact  Patrice Paradise, LCSW Phone Number:  [phone] 09/04/2019, 9:48 AM  Clinical Narrative:     CSW reached out to check on the status of patient's authorization. Patient's authorization is approved:  [employer] #[phone] Health Plan #- pending Case Manager: Earlene Plater Authorization dates: 7/3-7/8 Fax Number: [phone]   CSW alerted MD that patient's authorization has been approved.  TOC team will continue to discharge planning needs.   Expected Discharge Plan: Skilled Nursing Facility Barriers to Discharge: Continued Medical Work up, English as a second language teacher  Expected Discharge Plan and Services Expected Discharge Plan: Skilled Nursing Facility In-house Referral: Clinical Social Work Discharge Planning Services: [employer] Consult Post Acute Care Choice: Horticulturist, commercial, Skilled Nursing Facility, Home Health Living arrangements for the past 2 months: Apartment, Independent Living Facility                                       Social Determinants of Health (SDOH) Interventions    Readmission Risk Interventions Readmission Risk Prevention Plan 09/03/2019  Post Dischage Appt Not Complete  Medication Screening Complete  Transportation Screening Complete  Some recent data might be hidden

## 2019-09-05 LAB — BASIC METABOLIC PANEL
Anion gap: 7 (ref 5–15)
BUN: 10 mg/dL (ref 8–23)
CO2: 24 mmol/L (ref 22–32)
Calcium: 8.7 mg/dL — ABNORMAL LOW (ref 8.9–10.3)
Chloride: 105 mmol/L (ref 98–111)
Creatinine, Ser: 0.56 mg/dL (ref 0.44–1.00)
GFR calc Af Amer: >60 mL/min (ref >60)
GFR calc non Af Amer: >60 mL/min (ref >60)
Glucose, Bld: 113 mg/dL — ABNORMAL HIGH (ref 70–99)
Potassium: 4.3 mmol/L (ref 3.5–5.1)
Sodium: 136 mmol/L (ref 135–145)

## 2019-09-05 MED ORDER — CEPHALEXIN 500 MG PO CAPS: 500.0000 mg | ORAL_CAPSULE | Freq: Three times a day (TID) | ORAL | 0 refills | Status: AC

## 2019-09-05 NOTE — Progress Notes (Signed)
[facility] PROGRESS NOTE    Progress Note  Victoria Morris  GYI:[id number] DOB: December 17, 1935 DOA: 09/02/2019 PCP: Elijio Miles., MD     Brief Narrative:   Victoria Morris is an 84 y.o. female history significant of essential hypertension, GERD and hypothyroidism who was seen at her PCPs office on 09/01/2027 day prior to admission she was complaining of right side flank pain and difficulty urinating was diagnosed with a kidney stone and UTI was discharged on antibiotics (ciprofloxacin) home came back for ongoing symptoms as well as fever which has been worsening. She was found to be febrile hemodynamically stable UA was unimpressive she has been on antibiotics Assessment/Plan:   Acute pyelonephritis Continue oral Keflex, she has remained afebrile with no leukocytosis. Physical therapy evaluated the patient and recommended home health PT. Patient is medically stable for transfer awaiting insurance approval.    Accidental fall leading to right hip pain: Hip x-ray showed degenerative changes but no acute fractures. Physical therapy evaluated the patient and recommended skilled nursing facility.  Hyperlipidemia: Continue Lipitor and fenofibrate.  Essential hypertension: Continue lisinopril blood pressure is fairly controlled.  Severe constipation: De-escalate MiraLAX to once a day.  Cognitive impairment: TSH of 2.4, vitamin B12 1000. Will need further work-up for possible dementia as an outpatient.    DVT prophylaxis: lovenox Family Communication:none Status is: Inpatient  Remains inpatient appropriate because:Hemodynamically unstable   Dispo: The patient is from: Home              Anticipated d/c is to: Skilled nursing facility              Anticipated d/c date is: 1 day              Patient currently is medically stable to d/c.  To DC to skilled nursing facility.  Code Status:     Code Status Orders  (From admission, onward)         Start      Ordered   09/03/19 0437  Full code  Continuous        09/03/19 0442        Code Status History    This patient has a current code status but no historical code status.   Advance Care Planning Activity        IV Access:    Peripheral IV   Procedures and diagnostic studies:   No results found.   Medical Consultants:    None.  Anti-Infectives:   IV Rocephin  Subjective:    Victoria Morris no complaints today she relates her dysuria is improved.  Objective:    Vitals:   09/04/19 0513 09/04/19 1709 09/04/19 1905 09/05/19 0401  BP: (!) 149/80 (!) 152/66 (!) 145/60 (!) 145/76  Pulse: 70 64 68 74  Resp: 18 16 17 17   Temp: 98.3 F (36.8 C) 98.5 F (36.9 C) 98.1 F (36.7 C) 98 F (36.7 C)  TempSrc: Oral Oral Oral Oral  SpO2: 98% 96% 98% 97%  Weight:      Height:       SpO2: 97 % O2 Flow Rate (L/min): 2 L/min   Intake/Output Summary (Last 24 hours) at 09/05/2019 0752 Last data filed at 09/05/2019 11/06/2019 Gross per 24 hour  Intake --  Output 1300 ml  Net -1300 ml   Filed Weights   09/02/19 1913  Weight: 73.5 kg    Exam: General exam: In no acute distress. Respiratory system: Good air movement and clear to auscultation. Cardiovascular system: S1 &  S2 heard, RRR. No JVD. Gastrointestinal system: Abdomen is nondistended, soft and nontender.  Extremities: No pedal edema. Skin: No rashes, lesions or ulcers Psychiatry: Judgement and insight appear normal. Mood & affect appropriate.   Data Reviewed:    Labs: Basic Metabolic Panel: Recent Labs  Lab 09/02/19 1919 09/02/19 1919 09/03/19 0745 09/05/19 0136  NA 137  --  138 136  K 3.7   < > 3.0* 4.3  CL 103  --  105 105  CO2 25  --  25 24  GLUCOSE 129*  --  94 113*  BUN 23  --  11 10  CREATININE 0.64  --  0.58 0.56  CALCIUM 8.7*  --  8.2* 8.7*  MG  --   --  1.5*  --   PHOS  --   --  3.2  --    < > = values in this interval not displayed.   GFR Estimated Creatinine Clearance: 52 mL/min  (by C-G formula based on SCr of 0.56 mg/dL). Liver Function Tests: Recent Labs  Lab 09/02/19 1919 09/03/19 0745  AST 25 27  ALT 21 21  ALKPHOS 46 39  BILITOT 0.9 0.4  PROT 6.7 5.2*  ALBUMIN 4.1 3.0*   No results for input(s): LIPASE, AMYLASE in the last 168 hours. No results for input(s): AMMONIA in the last 168 hours. Coagulation profile Recent Labs  Lab 09/02/19 1919  INR 1.1   COVID-19 Labs  No results for input(s): DDIMER, FERRITIN, LDH, CRP in the last 72 hours.  Lab Results  Component Value Date   SARSCOV2NAA NEGATIVE 09/02/2019    CBC: Recent Labs  Lab 09/02/19 1919  WBC 8.8  NEUTROABS 8.2*  HGB 10.4*  HCT 32.5*  MCV 91.3  PLT 253   Cardiac Enzymes: No results for input(s): CKTOTAL, CKMB, CKMBINDEX, TROPONINI in the last 168 hours. BNP (last 3 results) No results for input(s): PROBNP in the last 8760 hours. CBG: No results for input(s): GLUCAP in the last 168 hours. D-Dimer: No results for input(s): DDIMER in the last 72 hours. Hgb A1c: No results for input(s): HGBA1C in the last 72 hours. Lipid Profile: No results for input(s): CHOL, HDL, LDLCALC, TRIG, CHOLHDL, LDLDIRECT in the last 72 hours. Thyroid function studies: Recent Labs    09/04/19 1032  TSH 2.407   Anemia work up: Recent Labs    09/04/19 1032  VITAMINB12 1,753*   Sepsis Labs: Recent Labs  Lab 09/02/19 1919  WBC 8.8  LATICACIDVEN 1.0   Microbiology Recent Results (from the past 240 hour(s))  Culture, blood (Routine x 2)     Status: None (Preliminary result)   Collection Time: 09/02/19  7:19 PM   Specimen: BLOOD  Result Value Ref Range Status   Specimen Description   Final    BLOOD BLOOD LEFT ARM Performed at [facility] [place], [address] [place], [place] [address]    Special Requests   Final    BOTTLES DRAWN AEROBIC AND ANAEROBIC Blood Culture adequate volume Performed at [facility] [place], [address] [place], [place] [address]    Culture    Final    NO GROWTH 2 DAYS Performed at [facility], [address] [place], [place] [address]    Report Status PENDING  Incomplete  Culture, blood (Routine x 2)     Status: None (Preliminary result)   Collection Time: 09/02/19  7:35 PM   Specimen: BLOOD  Result Value Ref Range Status  Specimen Description   Final    BLOOD BLOOD LEFT HAND Performed at [facility], [address] [place], [place] [address]    Special Requests   Final    BOTTLES DRAWN AEROBIC AND ANAEROBIC Blood Culture adequate volume Performed at [facility], [address] [place], [place] [address]    Culture   Final    NO GROWTH 2 DAYS Performed at [facility], [address], [place], [place] [address]    Report Status PENDING  Incomplete  SARS Coronavirus 2 by RT PCR (hospital order, performed in [facility] hospital lab) Nasopharyngeal Nasopharyngeal Swab     Status: None   Collection Time: 09/02/19  7:55 PM   Specimen: Nasopharyngeal Swab  Result Value Ref Range Status   SARS Coronavirus 2 NEGATIVE NEGATIVE Final    Comment: (NOTE) SARS-CoV-2 target nucleic acids are NOT DETECTED.  The SARS-CoV-2 RNA is generally detectable in upper and lower respiratory specimens during the acute phase of infection. The lowest concentration of SARS-CoV-2 viral copies this assay can detect is 250 copies / mL. A negative result does not preclude SARS-CoV-2 infection and should not be used as the sole basis for treatment or other patient management decisions.  A negative result may occur with improper specimen collection / handling, submission of specimen other than nasopharyngeal swab, presence of viral mutation(s) within the areas targeted by this assay, and inadequate number of viral copies (<250 copies / mL). A negative result must be combined with clinical observations, patient history, and epidemiological information.  Fact Sheet for Patients:     BoilerBrush.com.cy  Fact Sheet for Healthcare Providers: https://pope.com/  This test is not yet approved or  cleared by the [place] FDA and has been authorized for detection and/or diagnosis of SARS-CoV-2 by FDA under an Emergency Use Authorization (EUA).  This EUA will remain in effect (meaning this test can be used) for the duration of the COVID-19 declaration under Section 564(b)(1) of the Act, 21 U.S.C. section 360bbb-3(b)(1), unless the authorization is terminated or revoked sooner.  Performed at [facility], [address] [place], [place] [address]   Urine culture     Status: None   Collection Time: 09/02/19  9:30 PM   Specimen: In/Out Cath Urine  Result Value Ref Range Status   Specimen Description   Final    IN/OUT CATH URINE Performed at [facility], [address] [place], [place] [address]    Special Requests   Final    NONE Performed at [facility], [address] [place], [place] [address]    Culture   Final    NO GROWTH Performed at [facility], [address] [place], [place] [address]    Report Status 09/04/2019 FINAL  Final     Medications:   . aspirin EC  81 mg Oral Daily  . atorvastatin  80 mg Oral Daily  . calcium carbonate  1 tablet Oral Q breakfast  . cephALEXin  500 mg Oral Q8H  . escitalopram  10 mg Oral q morning - 10a  . fenofibrate  160 mg Oral Daily  . levothyroxine  88 mcg Oral Daily  . lisinopril  20 mg Oral Daily  . pantoprazole  40 mg Oral Daily  . potassium chloride  20 mEq Oral BID   Continuous Infusions:     LOS: 2 days   Marinda Elk  [facility]  09/05/2019, 7:52 AM

## 2019-09-05 NOTE — Discharge Summary (Signed)
Physician Discharge Summary  Victoria Morris ION:[id number] DOB: 1935-03-05 DOA: 09/02/2019  PCP: Elijio MilesWeaver, John W., MD  Admit date: 09/02/2019 Discharge date: 09/05/2019  Admitted From: Home Disposition:  SNF  Recommendations for Outpatient Follow-up:  1. Follow up with PCP in 1-2 weeks, she will need to be work-up for possible dementia as an outpatient 2 - 4weeks after discharge   Home Health:No Equipment/Devices:None  Discharge Condition:Stable CODE STATUS: Full Diet recommendation: Heart Healthy  Brief/Interim Summary: 84 y.o. female history significant of essential hypertension, GERD and hypothyroidism who was seen at her PCPs office on 09/01/2027 day prior to admission she was complaining of right side flank pain and difficulty urinating was diagnosed with a kidney stone and UTI was discharged on antibiotics (ciprofloxacin) home came back for ongoing symptoms as well as fever which has been worsening  Discharge Diagnoses:  Principal Problem:   Acute pyelonephritis Active Problems:   Hypercholesterolemia   Essential hypertension   Hypocalcemia   GERD (gastroesophageal reflux disease)   Hypothyroidism  Acute pyelonephritis: She failed outpatient treatment with ciprofloxacin, she was readmitted to the hospital started on IV Rocephin, previous urine cultures show E. coli sensitive to Keflex, blood cultures have been negative. She will continue Keflex as an outpatient for 5 additional days. Physical therapy evaluated the patient recommended skilled nursing facility.  Accidental fall leading to right hip pain: Imaging showed no acute fracture or dislocation. He was treated conservatively.  Hyperlipidemia: Continue current meds no changes made.  Essential hypertension: Well controlled no changes made to her medication.  Cognitive impairment: TSH and B12 normal will need further work-up as an outpatient.  Discharge Instructions  Discharge Instructions    Diet - low  sodium heart healthy   Complete by: As directed    Increase activity slowly   Complete by: As directed      Allergies as of 09/05/2019      Reactions   Penicillins Hives   Sulfa Antibiotics Hives      Medication List    STOP taking these medications   ciprofloxacin 500 MG tablet Commonly known as: CIPRO   HYDROcodone-acetaminophen 5-325 MG tablet Commonly known as: NORCO/VICODIN   nitrofurantoin (macrocrystal-monohydrate) 100 MG capsule Commonly known as: MACROBID     TAKE these medications   anastrozole 1 MG tablet Commonly known as: ARIMIDEX Take 1 mg by mouth daily.   aspirin 81 MG EC tablet Take 81 mg by mouth daily.   atorvastatin 80 MG tablet Commonly known as: LIPITOR Take 80 mg by mouth daily.   cephALEXin 500 MG capsule Commonly known as: KEFLEX Take 1 capsule (500 mg total) by mouth every 8 (eight) hours.   cyanocobalamin 1000 MCG tablet Take 1,000 mcg by mouth daily.   escitalopram 10 MG tablet Commonly known as: LEXAPRO Take 10 mg by mouth every morning.   fenofibrate 160 MG tablet Take 160 mg by mouth daily.   fluticasone 50 MCG/ACT nasal spray Commonly known as: FLONASE Place 2 sprays into both nostrils daily.   levothyroxine 88 MCG tablet Commonly known as: SYNTHROID Take 88 mcg by mouth daily before breakfast.   lisinopril 20 MG tablet Commonly known as: ZESTRIL Take 20 mg by mouth daily.   melatonin 5 MG Tabs Take 5 mg by mouth at bedtime.   meloxicam 7.5 MG tablet Commonly known as: MOBIC Take 7.5 mg by mouth daily as needed for pain.   Omega-3 1000 MG Caps Take 1 g by mouth daily.   omeprazole 20 MG capsule Commonly  known as: PRILOSEC Take 20 mg by mouth daily.   rOPINIRole 0.5 MG tablet Commonly known as: REQUIP Take 0.5 mg by mouth at bedtime.   tamsulosin 0.4 MG Caps capsule Commonly known as: FLOMAX Take 0.4 mg by mouth daily.   tiZANidine 4 MG tablet Commonly known as: ZANAFLEX Take 4 mg by mouth 3 (three)  times daily.       Contact information for after-discharge care    Destination    [facility] .   Service: Skilled Nursing Contact information: [address] [place] [address] [phone]                 Allergies  Allergen Reactions  . Penicillins Hives  . Sulfa Antibiotics Hives    Consultations:  None   Procedures/Studies: DG Chest Portable 1 View  Result Date: 09/02/2019 CLINICAL DATA:  Fever, right hip pain, flank pain EXAM: PORTABLE CHEST 1 VIEW COMPARISON:  None. FINDINGS: Single frontal view of the chest demonstrates mild enlargement the cardiac silhouette. Mild diffuse interstitial prominence likely reflects scarring. No airspace disease, effusion, or pneumothorax. Prominent vascular shadow right paratracheal region. No acute bony abnormalities. IMPRESSION: 1. Interstitial scarring without superimposed airspace disease. Electronically Signed   By: Sharlet Salina M.D.   On: 09/02/2019 22:00   CT Renal Stone Study  Result Date: 09/02/2019 CLINICAL DATA:  Right flank pain EXAM: CT ABDOMEN AND PELVIS WITHOUT CONTRAST TECHNIQUE: Multidetector CT imaging of the abdomen and pelvis was performed following the standard protocol without IV contrast. COMPARISON:  None. FINDINGS: Lower chest: Bibasilar atelectasis. Cardiomegaly, coronary artery disease. No effusions. Hepatobiliary: Small layering stones within the gallbladder. No focal hepatic abnormality. Pancreas: No focal abnormality or ductal dilatation. Spleen: No focal abnormality.  Normal size. Adrenals/Urinary Tract: Calcification in the left renal hilum felt reflect calcified renal artery aneurysm, 9 mm. Calcification in the right renal hilum felt to be renovascular. No urinary tract stones or hydronephrosis. No renal or adrenal mass. Urinary bladder unremarkable. Stomach/Bowel: Appendix not definitively seen. Moderate stool burden in the colon. No evidence of bowel obstruction.  Vascular/Lymphatic: Aortic atherosclerosis. No enlarged abdominal or pelvic lymph nodes. Reproductive: Prior hysterectomy.  No adnexal masses. Other: No free fluid or free air. Musculoskeletal: No acute bony abnormality. Degenerative changes in the lumbar spine. IMPRESSION: No renal or ureteral stones.  No hydronephrosis. Aortic atherosclerosis. 9 mm left renal artery aneurysm. Moderate stool burden in the colon. No acute findings. Electronically Signed   By: Charlett Nose M.D.   On: 09/02/2019 21:02   DG Hip Unilat With Pelvis 2-3 Views Right  Result Date: 09/02/2019 CLINICAL DATA:  Chronic right hip pain EXAM: DG HIP (WITH OR WITHOUT PELVIS) 2-3V RIGHT COMPARISON:  None. FINDINGS: Degenerative changes in the hips bilaterally, right greater than left with joint space narrowing and spurring. No acute bony abnormality. Specifically, no fracture, subluxation, or dislocation. IMPRESSION: Degenerative changes in the hips bilaterally, right greater than left. No acute bony abnormality. Electronically Signed   By: Charlett Nose M.D.   On: 09/02/2019 20:59     Subjective: No new complaints.  Discharge Exam: Vitals:   09/04/19 1905 09/05/19 0401  BP: (!) 145/60 (!) 145/76  Pulse: 68 74  Resp: 17 17  Temp: 98.1 F (36.7 C) 98 F (36.7 C)  SpO2: 98% 97%   Vitals:   09/04/19 0513 09/04/19 1709 09/04/19 1905 09/05/19 0401  BP: (!) 149/80 (!) 152/66 (!) 145/60 (!) 145/76  Pulse: 70 64 68 74  Resp: Temp: 98.3 F (36.8 C) 98.5 F (36.9 C) 98.1 F (36.7 C) 98 F (36.7 C)  TempSrc: Oral Oral Oral Oral  SpO2: 98% 96% 98% 97%  Weight:      Height:        General: Pt is alert, awake, not in acute distress Cardiovascular: RRR, S1/S2 +, no rubs, no gallops Respiratory: CTA bilaterally, no wheezing, no rhonchi Abdominal: Soft, NT, ND, bowel sounds + Extremities: no edema, no cyanosis    The results of significant diagnostics from this hospitalization (including imaging, microbiology,  ancillary and laboratory) are listed below for reference.     Microbiology: Recent Results (from the past 240 hour(s))  Culture, blood (Routine x 2)     Status: None (Preliminary result)   Collection Time: 09/02/19  7:19 PM   Specimen: BLOOD  Result Value Ref Range Status   Specimen Description   Final    BLOOD BLOOD LEFT ARM Performed at [facility], [address] [place], [place] [address]    Special Requests   Final    BOTTLES DRAWN AEROBIC AND ANAEROBIC Blood Culture adequate volume Performed at [facility], [address] [place], [place] [address]    Culture   Final    NO GROWTH 2 DAYS Performed at [facility], [address], [place], [place] [address]    Report Status PENDING  Incomplete  Culture, blood (Routine x 2)     Status: None (Preliminary result)   Collection Time: 09/02/19  7:35 PM   Specimen: BLOOD  Result Value Ref Range Status   Specimen Description   Final    BLOOD BLOOD LEFT HAND Performed at [facility], [address] [place], [place] [address]    Special Requests   Final    BOTTLES DRAWN AEROBIC AND ANAEROBIC Blood Culture adequate volume Performed at [facility], [address] [place], [place] [address]    Culture   Final    NO GROWTH 2 DAYS Performed at [facility], [address], [place], [place] [address]    Report Status PENDING  Incomplete  SARS Coronavirus 2 by RT PCR (hospital order, performed in [facility] hospital lab) Nasopharyngeal Nasopharyngeal Swab     Status: None   Collection Time: 09/02/19  7:55 PM   Specimen: Nasopharyngeal Swab  Result Value Ref Range Status   SARS Coronavirus 2 NEGATIVE NEGATIVE Final    Comment: (NOTE) SARS-CoV-2 target nucleic acids are NOT DETECTED.  The SARS-CoV-2 RNA is generally detectable in upper and lower respiratory specimens during the acute phase of infection. The lowest concentration of SARS-CoV-2 viral copies this assay can  detect is 250 copies / mL. A negative result does not preclude SARS-CoV-2 infection and should not be used as the sole basis for treatment or other patient management decisions.  A negative result may occur with improper specimen collection / handling, submission of specimen other than nasopharyngeal swab, presence of viral mutation(s) within the areas targeted by this assay, and inadequate number of viral copies (<250 copies / mL). A negative result must be combined with clinical observations, patient history, and epidemiological information.  Fact Sheet for Patients:   BoilerBrush.com.cy  Fact Sheet for Healthcare Providers: https://pope.com/  This test is not yet approved or  cleared by the [place] FDA and has been authorized for detection and/or diagnosis of SARS-CoV-2 by FDA under an  Emergency Use Authorization (EUA).  This EUA will remain in effect (meaning this test can be used) for the duration of the COVID-19 declaration under Section 564(b)(1) of the Act, 21 U.S.C. section 360bbb-3(b)(1), unless the authorization is terminated or revoked sooner.  Performed at [facility], [address] [place], [place] [address]   Urine culture     Status: None   Collection Time: 09/02/19  9:30 PM   Specimen: In/Out Cath Urine  Result Value Ref Range Status   Specimen Description   Final    IN/OUT CATH URINE Performed at [facility], [address] [place], [place] [address]    Special Requests   Final    NONE Performed at [facility], [address] [place], [place] [address]    Culture   Final    NO GROWTH Performed at [facility], [address] [place], [place], [place] [address]    Report Status 09/04/2019 FINAL  Final     Labs: BNP (last 3 results) No results for input(s): BNP in the last 8760 hours. Basic Metabolic Panel: Recent Labs  Lab 09/02/19 1919 09/03/19 0745  09/05/19 0136  NA 137 138 136  K 3.7 3.0* 4.3  CL 103 105 105  CO2 25 25 24   GLUCOSE 129* 94 113*  BUN 23 11 10   CREATININE 0.64 0.58 0.56  CALCIUM 8.7* 8.2* 8.7*  MG  --  1.5*  --   PHOS  --  3.2  --    Liver Function Tests: Recent Labs  Lab 09/02/19 1919 09/03/19 0745  AST 25 27  ALT 21 21  ALKPHOS 46 39  BILITOT 0.9 0.4  PROT 6.7 5.2*  ALBUMIN 4.1 3.0*   No results for input(s): LIPASE, AMYLASE in the last 168 hours. No results for input(s): AMMONIA in the last 168 hours. CBC: Recent Labs  Lab 09/02/19 1919  WBC 8.8  NEUTROABS 8.2*  HGB 10.4*  HCT 32.5*  MCV 91.3  PLT 253   Cardiac Enzymes: No results for input(s): CKTOTAL, CKMB, CKMBINDEX, TROPONINI in the last 168 hours. BNP: Invalid input(s): POCBNP CBG: No results for input(s): GLUCAP in the last 168 hours. D-Dimer No results for input(s): DDIMER in the last 72 hours. Hgb A1c No results for input(s): HGBA1C in the last 72 hours. Lipid Profile No results for input(s): CHOL, HDL, LDLCALC, TRIG, CHOLHDL, LDLDIRECT in the last 72 hours. Thyroid function studies Recent Labs    09/04/19 1032  TSH 2.407   Anemia work up Recent Labs    09/04/19 1032  VITAMINB12 1,753*   Urinalysis    Component Value Date/Time   COLORURINE YELLOW 09/02/2019 2130   APPEARANCEUR CLEAR 09/02/2019 2130   LABSPEC 1.010 09/02/2019 2130   PHURINE 5.5 09/02/2019 2130   GLUCOSEU NEGATIVE 09/02/2019 2130   HGBUR NEGATIVE 09/02/2019 2130   BILIRUBINUR NEGATIVE 09/02/2019 2130   KETONESUR NEGATIVE 09/02/2019 2130   PROTEINUR NEGATIVE 09/02/2019 2130   NITRITE NEGATIVE 09/02/2019 2130   LEUKOCYTESUR TRACE (A) 09/02/2019 2130   Sepsis Labs Invalid input(s): PROCALCITONIN,  WBC,  LACTICIDVEN Microbiology Recent Results (from the past 240 hour(s))  Culture, blood (Routine x 2)     Status: None (Preliminary result)   Collection Time: 09/02/19  7:19 PM   Specimen: BLOOD  Result Value Ref Range Status   Specimen  Description   Final    BLOOD BLOOD LEFT ARM Performed at [facility], [address] [place], [place] [address]    Special Requests   Final    BOTTLES DRAWN AEROBIC AND ANAEROBIC Blood Culture adequate volume Performed at [facility], [address] [place], [place] [address]    Culture   Final    NO GROWTH 2 DAYS Performed at [facility], [address] [place], [place] [address]    Report Status PENDING  Incomplete  Culture, blood (Routine x 2)     Status: None (Preliminary result)   Collection Time: 09/02/19  7:35 PM   Specimen: BLOOD  Result Value Ref Range Status   Specimen Description   Final    BLOOD BLOOD LEFT HAND Performed at [facility], [address] [place], [place] [address]    Special Requests   Final    BOTTLES DRAWN AEROBIC AND ANAEROBIC Blood Culture adequate volume Performed at [facility], [address] [place], [place] [address]    Culture   Final    NO GROWTH 2 DAYS Performed at [facility], [address], [place], [place] [address]    Report Status PENDING  Incomplete  SARS Coronavirus 2 by RT PCR (hospital order, performed in [facility] hospital lab) Nasopharyngeal Nasopharyngeal Swab     Status: None   Collection Time: 09/02/19  7:55 PM   Specimen: Nasopharyngeal Swab  Result Value Ref Range Status   SARS Coronavirus 2 NEGATIVE NEGATIVE Final    Comment: (NOTE) SARS-CoV-2 target nucleic acids are NOT DETECTED.  The SARS-CoV-2 RNA is generally detectable in upper and lower respiratory specimens during the acute phase of infection. The lowest concentration of SARS-CoV-2 viral copies this assay can detect is 250 copies / mL. A negative result does not preclude SARS-CoV-2 infection and should not be used as the sole basis for treatment or other patient management decisions.  A negative result may occur with improper specimen collection / handling, submission of specimen other than  nasopharyngeal swab, presence of viral mutation(s) within the areas targeted by this assay, and inadequate number of viral copies (<250 copies / mL). A negative result must be combined with clinical observations, patient history, and epidemiological information.  Fact Sheet for Patients:   BoilerBrush.com.cy  Fact Sheet for Healthcare Providers: https://pope.com/  This test is not yet approved or  cleared by the [place] FDA and has been authorized for detection and/or diagnosis of SARS-CoV-2 by FDA under an Emergency Use Authorization (EUA).  This EUA will remain in effect (meaning this test can be used) for the duration of the COVID-19 declaration under Section 564(b)(1) of the Act, 21 U.S.C. section 360bbb-3(b)(1), unless the authorization is terminated or revoked sooner.  Performed at [facility], [address] [place], [place] [address]   Urine culture     Status: None   Collection Time: 09/02/19  9:30 PM   Specimen: In/Out Cath Urine  Result Value Ref Range Status   Specimen Description   Final    IN/OUT CATH URINE Performed at [facility], [address] [place], [place] [address]    Special Requests   Final    NONE Performed at [facility], [address] [place], [place] [address]    Culture   Final    NO GROWTH Performed at [facility], [address] [place] [place], [place] [address]    Report Status 09/04/2019 FINAL  Final  Time coordinating discharge: Over 30 minutes  SIGNED:   Marinda Elk, MD  [facility] 09/05/2019, 9:04 AM Pager   If 7PM-7AM, please contact night-coverage [email or web address] Password [email or web address]

## 2019-09-05 NOTE — Discharge Instructions (Signed)
Pyelonephritis, Adult  Pyelonephritis is an infection that occurs in the kidney. The kidneys are organs that help clean the blood by moving waste out of the blood and into the pee (urine). This infection can happen quickly, or it can last for a long time. In most cases, it clears up with treatment and does not cause other problems. What are the causes? This condition may be caused by:  Germs (bacteria) going from the bladder up to the kidney. This may happen after having a bladder infection.  Germs going from the blood to the kidney. What increases the risk? This condition is more likely to develop in:  Pregnant women.  Older people.  People who have any of these conditions: ? Diabetes. ? Inflammation of the prostate gland (prostatitis), in males. ? Kidney stones or bladder stones. ? Other problems with the kidney or the parts of your body that carry pee from the kidneys to the bladder (ureters). ? Cancer.  People who have a small, thin tube (catheter) placed in the bladder.  People who are sexually active.  Women who use a medicine that kills sperm (spermicide) to prevent pregnancy.  People who have had a prior urinary tract infection (UTI). What are the signs or symptoms? Symptoms of this condition include:  Peeing often.  A strong urge to pee right away.  Burning or stinging when peeing.  Belly pain.  Back pain.  Pain in the side (flank area).  Fever or chills.  Blood in the pee, or dark pee.  Feeling sick to your stomach (nauseous) or throwing up (vomiting). How is this treated? This condition may be treated by:  Taking antibiotic medicines by mouth (orally).  Drinking enough fluids. If the infection is bad, you may need to stay in the hospital. You may be given antibiotics and fluids that are put directly into a vein through an IV tube. In some cases, other treatments may be needed. Follow these instructions at home: Medicines  Take your antibiotic  medicine as told by your doctor. Do not stop taking the antibiotic even if you start to feel better.  Take over-the-counter and prescription medicines only as told by your doctor. General instructions   Drink enough fluid to keep your pee pale yellow.  Avoid caffeine, tea, and carbonated drinks.  Pee (urinate) often. Avoid holding in pee for long periods of time.  Pee before and after sex.  After pooping (having a bowel movement), women should wipe from front to back. Use each tissue only once.  Keep all follow-up visits as told by your doctor. This is important. Contact a doctor if:  You do not feel better after 2 days.  Your symptoms get worse.  You have a fever. Get help right away if:  You cannot take your medicine or drink fluids as told.  You have chills and shaking.  You throw up.  You have very bad pain in your side or back.  You feel very weak or you pass out (faint). Summary  Pyelonephritis is an infection that occurs in the kidney.  In most cases, this infection clears up with treatment and does not cause other problems.  Take your antibiotic medicine as told by your doctor. Do not stop taking the antibiotic even if you start to feel better.  Drink enough fluid to keep your pee pale yellow. This information is not intended to replace advice given to you by your health care provider. Make sure you discuss any questions you have with   your health care provider. Document Revised: 12/23/2017 Document Reviewed: 12/23/2017 Elsevier Patient Education  2020 Elsevier Inc.  

## 2019-09-05 NOTE — TOC Transition Note (Signed)
Transition of Care [facility]) - CM/SW Discharge Note   Patient Details  Name: Victoria Morris MRN: [id number] Date of Birth: 10-30-1935  [facility] CM/SW Contact:  Patrice Paradise, LCSW Phone Number: [phone] 09/05/2019, 9:17 AM   Clinical Narrative:    Patient will DC to:?[facility] Anticipated DC date: 09/05/19? Family notified:?[facility] [id number]   Per MD patient ready for DC to [facility].  RN, patient, patient's family, and facility notified of DC. Discharge Summary sent to facility. RN given number for report [phone] room 403B. DC packet on chart. Ambulance transport requested for patient.   CSW signing off.   Judd Lien, [place] [phone]    Final next level of care: Skilled Nursing Facility Barriers to Discharge: No Barriers Identified   Patient Goals and CMS Choice Patient states their goals for this hospitalization and ongoing recovery are:: to get stronger CMS Medicare.gov Compare Post Acute Care list provided to:: Patient Represenative (must comment) (pt is part of [facility]) Choice offered to / list presented to : Patient, Adult Children  Discharge Placement              Patient chooses bed at: [facility] Patient to be transferred to facility by: [facility] Name of family member notified: Maurine Minister Patient and family notified of of transfer: 09/05/19  Discharge Plan and Services In-house Referral: Clinical Social Work Discharge Planning Services: CM Consult Post Acute Care Choice: Durable Medical Equipment, Skilled Nursing Facility, Home Health                               Social Determinants of Health (SDOH) Interventions     Readmission Risk Interventions Readmission Risk Prevention Plan 09/03/2019  Post Dischage Appt Not Complete  Medication Screening Complete  Transportation Screening Complete  Some recent data might be hidden

## 2019-09-07 LAB — CULTURE, BLOOD (ROUTINE X 2)
Culture: NO GROWTH
Culture: NO GROWTH
Special Requests: ADEQUATE
Special Requests: ADEQUATE

## 2019-09-07 LAB — FOLATE RBC
Folate, Hemolysate: 368 ng/mL
Folate, RBC: 1176 ng/mL (ref >498)
Hematocrit: 31.3 % — ABNORMAL LOW (ref 34.0–46.6)

## 2020-02-11 ENCOUNTER — Other Ambulatory Visit: Payer: Self-pay

## 2020-02-11 ENCOUNTER — Emergency Department (HOSPITAL_COMMUNITY): Payer: Medicare Other

## 2020-02-11 ENCOUNTER — Encounter (HOSPITAL_COMMUNITY): Payer: Self-pay

## 2020-02-11 ENCOUNTER — Emergency Department (HOSPITAL_COMMUNITY)
Admission: EM | Admit: 2020-02-11 | Discharge: 2020-02-12 | Disposition: A | Discharge status: Home / Self Care | Payer: Medicare Other | Attending: Emergency Medicine | Admitting: Emergency Medicine

## 2020-02-11 DIAGNOSIS — I10 Essential (primary) hypertension: Secondary | ICD-10-CM | POA: Insufficient documentation

## 2020-02-11 DIAGNOSIS — R1030 Lower abdominal pain, unspecified: Secondary | ICD-10-CM

## 2020-02-11 DIAGNOSIS — R509 Fever, unspecified: Secondary | ICD-10-CM | POA: Diagnosis not present

## 2020-02-11 DIAGNOSIS — D72829 Elevated white blood cell count, unspecified: Secondary | ICD-10-CM | POA: Insufficient documentation

## 2020-02-11 DIAGNOSIS — R7401 Elevation of levels of liver transaminase levels: Secondary | ICD-10-CM

## 2020-02-11 DIAGNOSIS — R1031 Right lower quadrant pain: Secondary | ICD-10-CM | POA: Insufficient documentation

## 2020-02-11 DIAGNOSIS — R600 Localized edema: Secondary | ICD-10-CM | POA: Diagnosis not present

## 2020-02-11 DIAGNOSIS — E039 Hypothyroidism, unspecified: Secondary | ICD-10-CM | POA: Insufficient documentation

## 2020-02-11 DIAGNOSIS — Z79899 Other long term (current) drug therapy: Secondary | ICD-10-CM | POA: Diagnosis not present

## 2020-02-11 DIAGNOSIS — Z7982 Long term (current) use of aspirin: Secondary | ICD-10-CM | POA: Diagnosis not present

## 2020-02-11 DIAGNOSIS — R112 Nausea with vomiting, unspecified: Secondary | ICD-10-CM | POA: Diagnosis not present

## 2020-02-11 HISTORY — DX: Cerebral infarction, unspecified: I63.9

## 2020-02-11 LAB — COMPREHENSIVE METABOLIC PANEL
ALT: 128 U/L — ABNORMAL HIGH (ref 0–44)
AST: 300 U/L — ABNORMAL HIGH (ref 15–41)
Albumin: 3.4 g/dL — ABNORMAL LOW (ref 3.5–5.0)
Alkaline Phosphatase: 63 U/L (ref 38–126)
Anion gap: 11 (ref 5–15)
BUN: 24 mg/dL — ABNORMAL HIGH (ref 8–23)
CO2: 23 mmol/L (ref 22–32)
Calcium: 8.7 mg/dL — ABNORMAL LOW (ref 8.9–10.3)
Chloride: 102 mmol/L (ref 98–111)
Creatinine, Ser: 0.77 mg/dL (ref 0.44–1.00)
GFR, Estimated: >60 mL/min (ref >60)
Glucose, Bld: 135 mg/dL — ABNORMAL HIGH (ref 70–99)
Potassium: 4 mmol/L (ref 3.5–5.1)
Sodium: 136 mmol/L (ref 135–145)
Total Bilirubin: 1.1 mg/dL (ref 0.3–1.2)
Total Protein: 6.4 g/dL — ABNORMAL LOW (ref 6.5–8.1)

## 2020-02-11 LAB — CBC WITH DIFFERENTIAL/PLATELET
Abs Immature Granulocytes: 0.08 10*3/uL — ABNORMAL HIGH (ref 0.00–0.07)
Basophils Absolute: 0 10*3/uL (ref 0.0–0.1)
Basophils Relative: 0 %
Eosinophils Absolute: 0 10*3/uL (ref 0.0–0.5)
Eosinophils Relative: 0 %
HCT: 30.8 % — ABNORMAL LOW (ref 36.0–46.0)
Hemoglobin: 10 g/dL — ABNORMAL LOW (ref 12.0–15.0)
Immature Granulocytes: 1 %
Lymphocytes Relative: 1 %
Lymphs Abs: 0.1 10*3/uL — ABNORMAL LOW (ref 0.7–4.0)
MCH: 29.3 pg (ref 26.0–34.0)
MCHC: 32.5 g/dL (ref 30.0–36.0)
MCV: 90.3 fL (ref 80.0–100.0)
Monocytes Absolute: 0.4 10*3/uL (ref 0.1–1.0)
Monocytes Relative: 3 %
Neutro Abs: 11.7 10*3/uL — ABNORMAL HIGH (ref 1.7–7.7)
Neutrophils Relative %: 95 %
Platelets: 251 10*3/uL (ref 150–400)
RBC: 3.41 MIL/uL — ABNORMAL LOW (ref 3.87–5.11)
RDW: 15.2 % (ref 11.5–15.5)
WBC: 12.3 10*3/uL — ABNORMAL HIGH (ref 4.0–10.5)
nRBC: 0 % (ref 0.0–0.2)

## 2020-02-11 LAB — URINALYSIS, ROUTINE W REFLEX MICROSCOPIC
Bilirubin Urine: NEGATIVE
Glucose, UA: NEGATIVE mg/dL
Hgb urine dipstick: NEGATIVE
Ketones, ur: NEGATIVE mg/dL
Leukocytes,Ua: NEGATIVE
Nitrite: NEGATIVE
Protein, ur: NEGATIVE mg/dL
Specific Gravity, Urine: 1.029 (ref 1.005–1.030)
pH: 5 (ref 5.0–8.0)

## 2020-02-11 LAB — LIPASE, BLOOD: Lipase: 18 U/L (ref 11–51)

## 2020-02-11 MED ORDER — SODIUM CHLORIDE 0.9 % IV BOLUS
1000.0000 mL | Freq: Once | INTRAVENOUS | Status: AC
  Administered 2020-02-11: 1000 mL via INTRAVENOUS

## 2020-02-11 MED ORDER — IOHEXOL 300 MG/ML  SOLN
100.0000 mL | Freq: Once | INTRAMUSCULAR | Status: AC | PRN
  Administered 2020-02-11: 100 mL via INTRAVENOUS

## 2020-02-11 MED ORDER — ONDANSETRON HCL 4 MG/2ML IJ SOLN
4.0000 mg | Freq: Once | INTRAMUSCULAR | Status: AC
  Administered 2020-02-11: 4 mg via INTRAVENOUS
  Filled 2020-02-11: qty 2

## 2020-02-11 MED ORDER — ACETAMINOPHEN 500 MG PO TABS
1000.0000 mg | ORAL_TABLET | Freq: Once | ORAL | Status: AC
  Administered 2020-02-11: 1000 mg via ORAL
  Filled 2020-02-11: qty 2

## 2020-02-11 MED ORDER — SODIUM CHLORIDE 0.9 % IV SOLN
2.0000 g | Freq: Once | INTRAVENOUS | Status: AC
  Administered 2020-02-11: 2 g via INTRAVENOUS
  Filled 2020-02-11: qty 2

## 2020-02-11 NOTE — Progress Notes (Signed)
A consult was received from an ED physician for cefepime per pharmacy dosing.  The patient's profile has been reviewed for ht/wt/allergies/indication/available labs.   A one time order has been placed for cefepime 2g.  Further antibiotics/pharmacy consults should be ordered by admitting physician if indicated.                       Thank you, Valentina Gu 02/11/2020  7:52 PM

## 2020-02-11 NOTE — ED Notes (Signed)
Pt. Documented in error see above note in chart. 

## 2020-02-11 NOTE — ED Provider Notes (Signed)
H/O uterine prolapse Pessary inserted yesterday, fell out today Fever, abdominal pain today Sent to ED from facility for further evaluation.   UA pending CT pending - review and treat appropriately. Per Dr. Darl Householder and Irene Pap, PA-C, if studies negative, can be discharged with fever of unknown etiology back to nursing home.   US shows: Cholelithiasis. Suspect area of focal adenomyomatosis at the gallbladder fundus. Slight gallbladder wall thickening elsewhere without sonographic Murphy sign.  To my exam, there is no RUQ tenderness specifically that would be concerning for cholecystitis. She is not having pain currently. She is requesting something to eat and drink.   Temperature recheck 98.8, 5.5 hours after Tylenol given.  UA negative for infection. No elevation of Total bili or alk phos. Transaminitis only. Hepatitis panel added.   She can be discharged home with recommendation for PCP follow up.     Charlann Lange, PA-C 02/13/20 2221    Drenda Freeze, MD 02/19/20 2006

## 2020-02-11 NOTE — ED Provider Notes (Signed)
[facility]-EMERGENCY DEPT Provider Note   CSN: [id number] Arrival date & time: 02/11/20  [id number]     History Chief Complaint  Patient presents with  . Fever    Wynne DustDianne Santor is a 84 y.o. female.  84 y.o female with an extensive PMH of HTN, organ prolapse s/p pessary insertion yesterday by [facility] Patient is currently at facility, she reports the pessary "fell out" yesterday after the procedure. She was found febrile last night given tylenol without resolution. Patient also endorses nausea along with dry heaving, states she has not been able to have keep food down. She also endorses pain along the lower abdomen localized to the left lower quadrant. She denies any chest pain, shortness of breath or other complaints.   The history is provided by the patient.  Fever Max temp prior to arrival:  101.8 Temp source:  Oral Severity:  Moderate Onset quality:  Gradual Duration:  1 day Timing:  Constant Progression:  Worsening Chronicity:  New Associated symptoms: nausea and vomiting   Associated symptoms: no chest pain, no headaches and no sore throat        Past Medical History:  Diagnosis Date  . Hypertension   . Stroke [facility](HCC)     Patient Active Problem List   Diagnosis Date Noted  . Acute pyelonephritis 09/03/2019  . Hypercholesterolemia 09/03/2019  . Essential hypertension 09/03/2019  . Hypocalcemia 09/03/2019  . GERD (gastroesophageal reflux disease) 09/03/2019  . Hypothyroidism 09/03/2019  . Pyelonephritis 09/02/2019    Past Surgical History:  Procedure Laterality Date  . BLADDER SURGERY       OB History   No obstetric history on file.     History reviewed. No pertinent family history.  Social History   Tobacco Use  . Smoking status: Never Smoker  . Smokeless tobacco: Never Used  Substance Use Topics  . Alcohol use: Never  . Drug use: Never    Home Medications Prior to Admission medications    Medication Sig Start Date End Date Taking? Authorizing Provider  acetaminophen (TYLENOL) 500 MG tablet Take 500 mg by mouth every 6 (six) hours as needed for headache or moderate pain.   Yes [provider]  amLODipine (NORVASC) 5 MG tablet Take 7.5 mg by mouth daily.   Yes [provider]  anastrozole (ARIMIDEX) 1 MG tablet Take 1 mg by mouth daily.  08/13/19  Yes [provider]  aspirin 325 MG tablet Take 325 mg by mouth daily.   Yes [provider]  atorvastatin (LIPITOR) 40 MG tablet Take 60 mg by mouth at bedtime.   Yes [provider]  Cholecalciferol (VITAMIN D) 125 MCG (5000 UT) CAPS Take 5,000 Units by mouth daily.   Yes [provider]  docusate sodium (COLACE) 100 MG capsule Take 100 mg by mouth daily.   Yes [provider]  escitalopram (LEXAPRO) 10 MG tablet Take 10 mg by mouth every morning.  06/07/19  Yes [provider]  fenofibrate 160 MG tablet Take 160 mg by mouth daily.  04/20/19  Yes [provider]  fluticasone (FLONASE) 50 MCG/ACT nasal spray Place 2 sprays into both nostrils daily.  04/28/19  Yes [provider]  levothyroxine (SYNTHROID) 88 MCG tablet Take 88 mcg by mouth daily before breakfast.  08/06/19  Yes [provider]  lisinopril (ZESTRIL) 40 MG tablet Take 40 mg by mouth daily.   Yes [provider]  loperamide (IMODIUM A-D) 2 MG tablet  Take 2-4 mg by mouth See admin instructions. Take 2 tablets after 1st loose stool and 1 tablet after each subsequent bowel movement   Yes [provider]  melatonin 3 MG TABS tablet Take 3 mg by mouth at bedtime.   Yes [provider]  Multiple Vitamins-Minerals (PRESERVISION AREDS 2) CAPS Take 1 tablet by mouth daily.   Yes [provider]  nitrofurantoin, macrocrystal-monohydrate, (MACROBID) 100 MG capsule Take 100 mg by mouth 2 (two) times daily. Start date :02/10/20   Yes [provider]   omeprazole (PRILOSEC) 20 MG capsule Take 20 mg by mouth daily. 08/25/19  Yes [provider]  ondansetron (ZOFRAN) 4 MG tablet Take 4 mg by mouth every 6 (six) hours as needed for nausea or vomiting.   Yes [provider]  oxybutynin (DITROPAN) 5 MG tablet Take 2.5 mg by mouth 2 (two) times daily.   Yes [provider]  polyvinyl alcohol (LIQUIFILM TEARS) 1.4 % ophthalmic solution Place 1 drop into both eyes 3 (three) times daily.   Yes [provider]  Probiotic Product (PROBIOTIC-10 ULTIMATE PO) Take 1 tablet by mouth daily.   Yes [provider]  propranolol (INDERAL) 10 MG tablet Take 10 mg by mouth daily.   Yes [provider]  rOPINIRole (REQUIP) 0.25 MG tablet Take 0.5 mg by mouth at bedtime.   Yes [provider]  traMADol (ULTRAM) 50 MG tablet Take 50 mg by mouth 2 (two) times daily.   Yes [provider]  cephALEXin (KEFLEX) 500 MG capsule Take 1 capsule (500 mg total) by mouth every 8 (eight) hours. Patient not taking: Reported on 02/11/2020 09/05/19   Marinda Elk, MD    Allergies    Penicillin g benzathine, Penicillins, Procaine, Sulfa antibiotics, and Sulfacetamide  Review of Systems   Review of Systems  Constitutional: Positive for fever.  HENT: Negative for sore throat.   Respiratory: Negative for shortness of breath.   Cardiovascular: Negative for chest pain.  Gastrointestinal: Positive for abdominal pain, nausea and vomiting.  Genitourinary: Negative for flank pain.  Musculoskeletal: Negative for back pain.  Neurological: Negative for light-headedness and headaches.  All other systems reviewed and are negative.   Physical Exam Updated Vital Signs BP (!) 121/51   Pulse 81   Temp 99 F (37.2 C) (Rectal)   Resp 20   SpO2 94%   Physical Exam Vitals and nursing note reviewed.  Constitutional:      Appearance: Normal appearance. She is ill-appearing.  HENT:     Head: Normocephalic and  atraumatic.     Nose: Nose normal.  Cardiovascular:     Rate and Rhythm: Normal rate.     Comments: 1+ BL pitting edema, per patient at baseline.  Pulmonary:     Effort: Pulmonary effort is normal.     Breath sounds: No wheezing or rales.     Comments: Lungs are clear to auscultation.  Abdominal:     General: Abdomen is flat. Bowel sounds are decreased.     Palpations: Abdomen is soft.     Tenderness: There is abdominal tenderness in the right lower quadrant and suprapubic area. There is no right CVA tenderness, left CVA tenderness or guarding. Positive signs include McBurney's sign.     Comments: Soft,midly tender to palpation along the   Musculoskeletal:     Cervical back: Normal range of motion and neck supple.     Right lower leg: 1+ Edema present.     Left  lower leg: 1+ Edema present.  Skin:    General: Skin is warm and dry.     Findings: No erythema.  Neurological:     Mental Status: She is alert and oriented to person, place, and time.     ED Results / Procedures / Treatments   Labs (all labs ordered are listed, but only abnormal results are displayed) Labs Reviewed  CBC WITH DIFFERENTIAL/PLATELET - Abnormal; Notable for the following components:      Result Value   WBC 12.3 (*)    RBC 3.41 (*)    Hemoglobin 10.0 (*)    HCT 30.8 (*)    Neutro Abs 11.7 (*)    Lymphs Abs 0.1 (*)    Abs Immature Granulocytes 0.08 (*)    All other components within normal limits  COMPREHENSIVE METABOLIC PANEL - Abnormal; Notable for the following components:   Glucose, Bld 135 (*)    BUN 24 (*)    Calcium 8.7 (*)    Total Protein 6.4 (*)    Albumin 3.4 (*)    AST 300 (*)    ALT 128 (*)    All other components within normal limits  CULTURE, BLOOD (ROUTINE X 2)  CULTURE, BLOOD (ROUTINE X 2)  LIPASE, BLOOD  URINALYSIS, ROUTINE W REFLEX MICROSCOPIC    EKG None  Radiology DG Chest Port 1 View  Result Date: 02/11/2020 CLINICAL DATA:  Fever. EXAM: PORTABLE CHEST 1 VIEW  COMPARISON:  September 02, 2019 FINDINGS: Mild, stable diffusely increased interstitial lung markings are seen. Mild, stable scarring and/or atelectasis is noted within the retrocardiac region of the left lung base. The cardiac silhouette is mildly enlarged. Degenerative changes seen within the thoracic spine. IMPRESSION: 1. Stable diffusely increased interstitial lung markings, likely chronic in nature. 2. Mild, stable left basilar scarring and/or atelectasis. Electronically Signed   By: Aram Candela M.D.   On: 02/11/2020 20:21    Procedures Procedures (including critical care time)  Medications Ordered in ED Medications  sodium chloride 0.9 % bolus 1,000 mL (1,000 mLs Intravenous New Bag/Given (Non-Interop) 02/11/20 2105)  acetaminophen (TYLENOL) tablet 1,000 mg (1,000 mg Oral Given 02/11/20 2102)  ondansetron (ZOFRAN) injection 4 mg (4 mg Intravenous Given 02/11/20 2103)  ceFEPIme (MAXIPIME) 2 g in sodium chloride 0.9 % 100 mL IVPB (2 g Intravenous New Bag/Given (Non-Interop) 02/11/20 2108)  iohexol (OMNIPAQUE) 300 MG/ML solution 100 mL (100 mLs Intravenous Contrast Given 02/11/20 2204)    ED Course  I have reviewed the triage vital signs and the nursing notes.  Pertinent labs & imaging results that were available during my care of the patient were reviewed by me and considered in my medical decision making (see chart for details).  Clinical Course as of 02/11/20 2212  Fri Feb 11, 2020  2050 WBC(!): 12.3 Slight leukocytosis [JS]  2116 AST(!): 300 [JS]  2116 ALT(!): 128 [JS]    Clinical Course User Index [JS] Claude Manges, PA-C   MDM Rules/Calculators/A&P   Patient s/p pessary by Dr. Sabino Gasser presents to the ED febrile after procedure performed yesterday.  Reports pessary fell out around 2 PM yesterday, began experiencing a fever last night, given Tylenol at the facility without improvement.  Arrived with a fever of 101.8, Tylenol has been ordered for patient.  She is also endorsing  some lower abdominal pain.  Also endorsing dry heaving along with nausea.  Has not taken any medication for improvement in her symptoms.  Vital signs are remarkable for a fever 1-1.8, otherwise  hemodynamically stable, abdomen is tender to palpation along the left and right lower quadrant.  She does report some vaginal bleeding, the amount of a drop on a pad every hour.  According to her chart, procedure by Dr. Sabino Gasser was performed at [facility], suspicion for perforation, postop complication.  Blood cultures, basic blood work has been obtained.  Her chest x-ray did not show any pneumonia, signs of infection.  Mild atelectasis.: 1. Stable diffusely increased interstitial lung markings, likely  chronic in nature.  2. Mild, stable left basilar scarring and/or atelectasis.       Interpretation of her labs by me revealed a leukocytosis of 12.3, slight drop in her hemoglobin, she is postop at this time.  Lipase level is 18.  CMP remarkable for elevated LFTS 300:128 significant elevated from her previous visit.  Creatinine levels within normal limits.  She was also given Zofran for nausea, Tylenol for fever, cefepime for prophylactic coverage.  Temperature rechecked rectally at 99, she is currently pending CT Abdomen. Patient care signed out to incoming provider Keno PA.    Portions of this note were generated with Scientist, clinical (histocompatibility and immunogenetics). Dictation errors may occur despite best attempts at proofreading.  Final Clinical Impression(s) / ED Diagnoses Final diagnoses:  Fever, unspecified fever cause    Rx / DC Orders ED Discharge Orders    None       Claude Manges, Cordelia Poche 02/11/20 2212    Charlynne Pander, MD 02/12/20 [phone]

## 2020-02-11 NOTE — ED Triage Notes (Signed)
Pt arrives [facility] from [facility] with complaints of a fever following a bladder tact yesterday morning. Per EMS: facility reports pt had a bladder tact yesterday am and yesterday pm the "ring" fell out. Pt is now febrile. EMS reports oral temp of 102.  HR:80 BP: 126/62 O2 sat: 96% RR:20

## 2020-02-11 NOTE — ED Notes (Signed)
Patient refused in and out cath at this time. Stated she "will try to pee." Patient placed on bedpan.

## 2020-02-11 NOTE — ED Notes (Signed)
Ultrasound at bedside

## 2020-02-12 LAB — HEPATITIS PANEL, ACUTE
HCV Ab: NONREACTIVE
Hep A IgM: NONREACTIVE
Hep B C IgM: NONREACTIVE
Hepatitis B Surface Ag: NONREACTIVE

## 2020-02-12 NOTE — Discharge Instructions (Addendum)
You can be discharged home tonight. You evaluation in the emergency department did not reveal the cause of your symptoms but has determined that no life threatening condition exists.   You will need to follow up with your doctor in 1-2 days for recheck of fever and abdominal pain, as well as abnormal liver function tests, for further evaluation in the outpatient setting.   Please return to the emergency department with any new or worsening symptoms.

## 2020-02-12 NOTE — ED Notes (Signed)
Repositioned patient  Sheet change

## 2020-02-12 NOTE — ED Notes (Signed)
Patient received breakfast tray 

## 2020-02-12 NOTE — ED Notes (Addendum)
Vicky from [facility] notified of patients discharge and given report.

## 2020-02-12 NOTE — ED Notes (Signed)
Patient urinated on bedpan.

## 2020-02-12 NOTE — ED Notes (Signed)
Dispatch notified to transfer patient back to facility.

## 2020-02-12 NOTE — ED Notes (Signed)
Pt. Documented in error see above note in chart. 

## 2020-02-16 LAB — CULTURE, BLOOD (ROUTINE X 2): Culture: NO GROWTH

## 2021-04-11 IMAGING — DX DG CHEST 1V PORT
1 series · 1 of 1 positions shown · non-contrast
Comparison: September 02, 2019

CLINICAL DATA: Fever.

EXAM:
PORTABLE CHEST 1 VIEW

[chest ap]
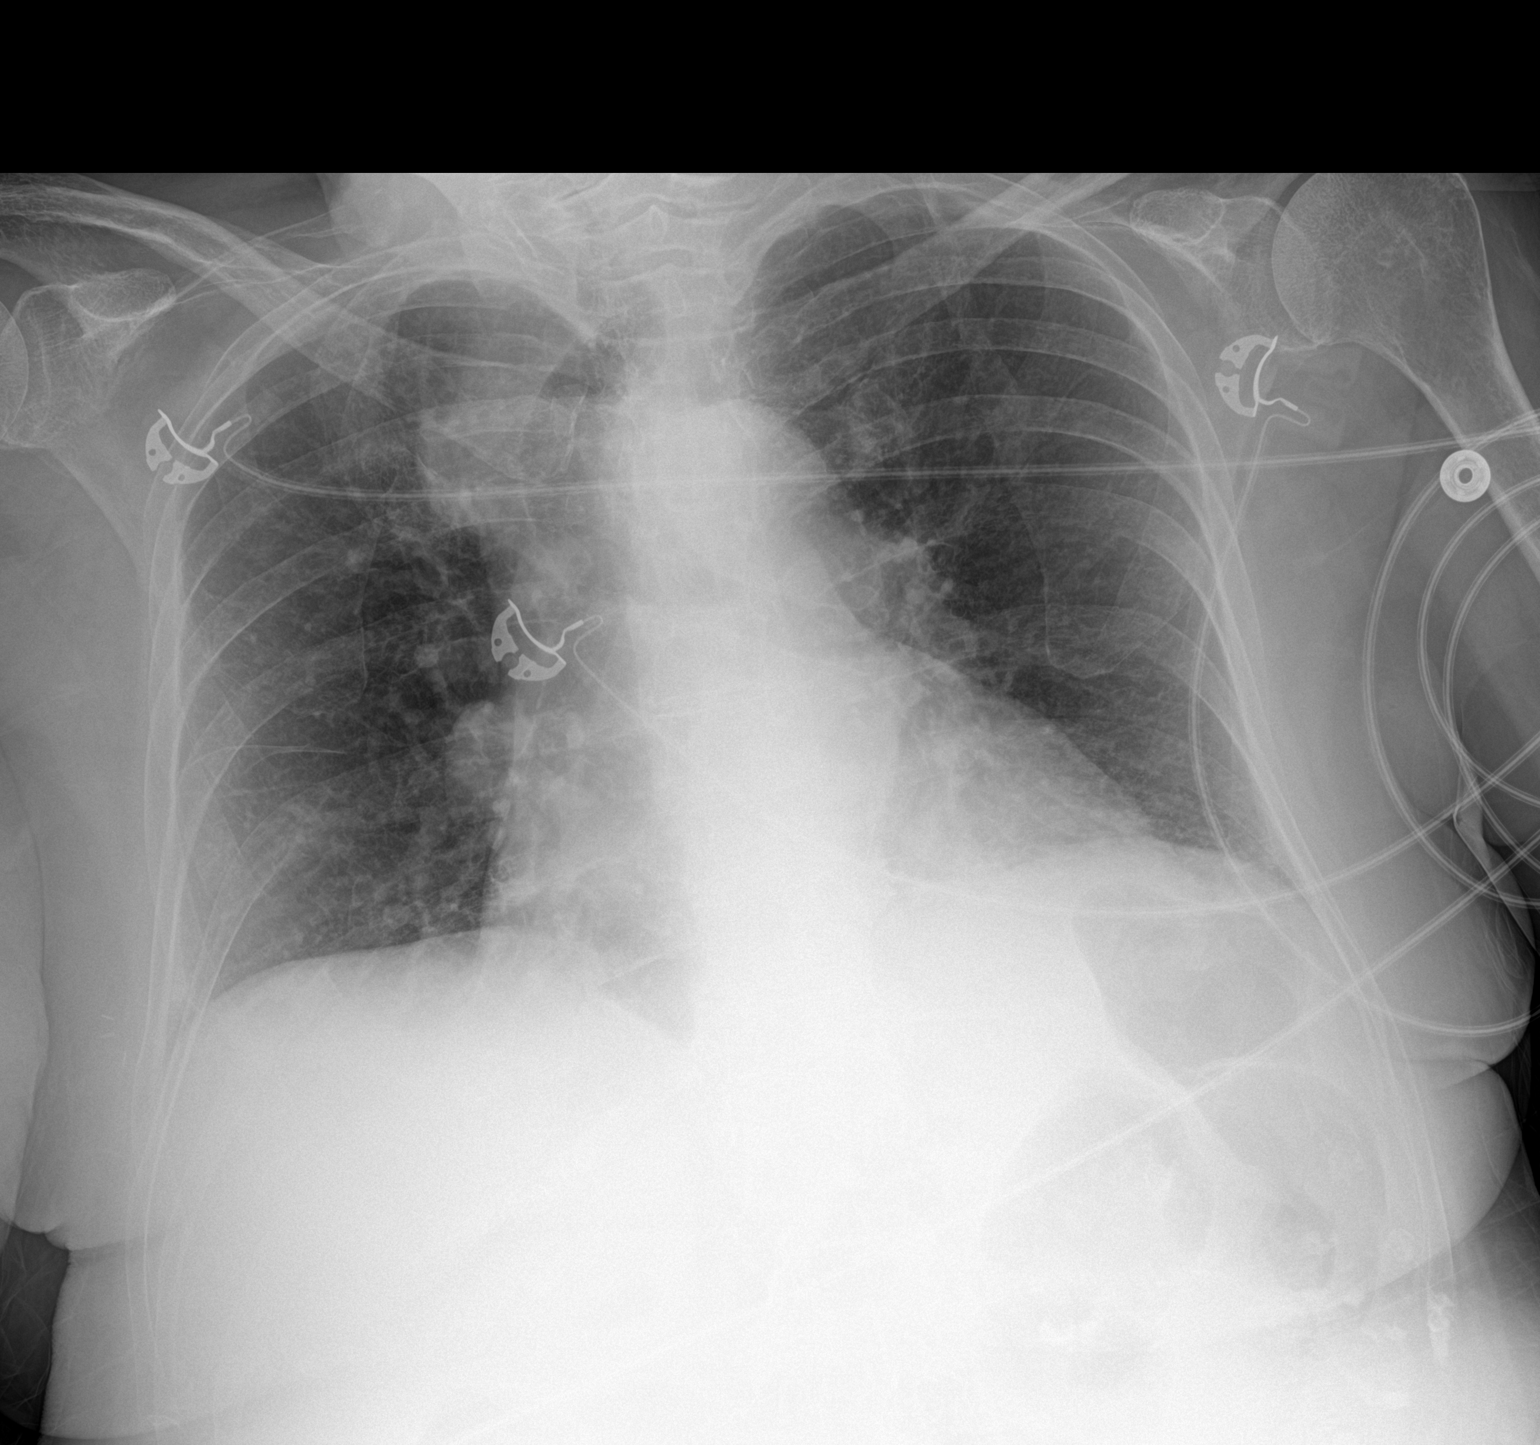

[1 of 1 positions shown; findings below may reference images not displayed]

FINDINGS: Mild, stable diffusely increased interstitial lung markings are
seen. Mild, stable scarring and/or atelectasis is noted within the
retrocardiac region of the left lung base. The cardiac silhouette is
mildly enlarged. Degenerative changes seen within the thoracic
spine.
IMPRESSION: 1. Stable diffusely increased interstitial lung markings, likely
chronic in nature.
2. Mild, stable left basilar scarring and/or atelectasis.
